# Patient Record
Sex: Female | Born: 1940 | Race: White | Hispanic: Yes | Smoking: Never smoker
Health system: Southern US, Community
[De-identification: ages and names within clinical notes are randomized; demographics above are authoritative.]

## PROBLEM LIST (undated history)

## (undated) DIAGNOSIS — E785 Hyperlipidemia, unspecified: Secondary | ICD-10-CM

## (undated) DIAGNOSIS — K219 Gastro-esophageal reflux disease without esophagitis: Secondary | ICD-10-CM

## (undated) DIAGNOSIS — F329 Major depressive disorder, single episode, unspecified: Secondary | ICD-10-CM

## (undated) DIAGNOSIS — M810 Age-related osteoporosis without current pathological fracture: Secondary | ICD-10-CM

## (undated) DIAGNOSIS — F32A Depression, unspecified: Secondary | ICD-10-CM

## (undated) DIAGNOSIS — I10 Essential (primary) hypertension: Secondary | ICD-10-CM

## (undated) DIAGNOSIS — H409 Unspecified glaucoma: Secondary | ICD-10-CM

## (undated) DIAGNOSIS — K5792 Diverticulitis of intestine, part unspecified, without perforation or abscess without bleeding: Secondary | ICD-10-CM

## (undated) DIAGNOSIS — M1990 Unspecified osteoarthritis, unspecified site: Secondary | ICD-10-CM

## (undated) DIAGNOSIS — L80 Vitiligo: Secondary | ICD-10-CM

## (undated) HISTORY — DX: Diverticulitis of intestine, part unspecified, without perforation or abscess without bleeding: K57.92

## (undated) HISTORY — DX: Essential (primary) hypertension: I10

## (undated) HISTORY — DX: Hyperlipidemia, unspecified: E78.5

## (undated) HISTORY — DX: Age-related osteoporosis without current pathological fracture: M81.0

## (undated) HISTORY — DX: Depression, unspecified: F32.A

## (undated) HISTORY — DX: Vitiligo: L80

## (undated) HISTORY — DX: Unspecified osteoarthritis, unspecified site: M19.90

## (undated) HISTORY — DX: Unspecified glaucoma: H40.9

## (undated) HISTORY — DX: Major depressive disorder, single episode, unspecified: F32.9

## (undated) HISTORY — DX: Gastro-esophageal reflux disease without esophagitis: K21.9

---

## 2016-02-18 ENCOUNTER — Encounter: Payer: Self-pay | Admitting: Family Medicine

## 2016-03-08 LAB — HM MAMMOGRAPHY

## 2016-11-24 LAB — BASIC METABOLIC PANEL
BUN: 12 (ref 4–21)
Creatinine: 0.4 — AB (ref <=1.1)
GLUCOSE: 99
Potassium: 4.4 (ref 3.4–5.3)
SODIUM: 141 (ref 137–147)

## 2016-11-24 LAB — TSH: TSH: 2.93 (ref <=5.90)

## 2016-11-24 LAB — LIPID PANEL
Cholesterol: 180 (ref 0–200)
HDL: 61 (ref 35–70)
LDL CALC: 97
TRIGLYCERIDES: 109 (ref 40–160)

## 2016-11-24 LAB — CBC AND DIFFERENTIAL
HEMATOCRIT: 40 (ref 36–46)
HEMOGLOBIN: 13.5 (ref 12.0–16.0)

## 2016-11-24 LAB — HEMOGLOBIN A1C: HEMOGLOBIN A1C: 5.3

## 2016-11-24 LAB — VITAMIN D 25 HYDROXY (VIT D DEFICIENCY, FRACTURES): Vit D, 25-Hydroxy: 47.3

## 2017-11-16 ENCOUNTER — Encounter: Payer: Self-pay | Admitting: Family Medicine

## 2017-12-12 ENCOUNTER — Other Ambulatory Visit: Payer: Self-pay

## 2017-12-12 ENCOUNTER — Ambulatory Visit: Payer: Medicare PPO | Admitting: Family Medicine

## 2017-12-12 ENCOUNTER — Encounter: Payer: Self-pay | Admitting: Family Medicine

## 2017-12-12 VITALS — BP 142/80 | HR 63 | Temp 98.0°F | Ht 61.5 in | Wt 147.2 lb

## 2017-12-12 DIAGNOSIS — I10 Essential (primary) hypertension: Secondary | ICD-10-CM | POA: Insufficient documentation

## 2017-12-12 DIAGNOSIS — K219 Gastro-esophageal reflux disease without esophagitis: Secondary | ICD-10-CM | POA: Diagnosis not present

## 2017-12-12 DIAGNOSIS — Z8719 Personal history of other diseases of the digestive system: Secondary | ICD-10-CM

## 2017-12-12 DIAGNOSIS — M810 Age-related osteoporosis without current pathological fracture: Secondary | ICD-10-CM

## 2017-12-12 DIAGNOSIS — F339 Major depressive disorder, recurrent, unspecified: Secondary | ICD-10-CM | POA: Insufficient documentation

## 2017-12-12 HISTORY — DX: Age-related osteoporosis without current pathological fracture: M81.0

## 2017-12-12 LAB — CBC WITH DIFFERENTIAL/PLATELET
BASOS PCT: 0.6 % (ref 0.0–3.0)
Basophils Absolute: 0 10*3/uL (ref 0.0–0.1)
EOS PCT: 2 % (ref 0.0–5.0)
Eosinophils Absolute: 0.1 10*3/uL (ref 0.0–0.7)
HCT: 38.2 % (ref 36.0–46.0)
Hemoglobin: 13.2 g/dL (ref 12.0–15.0)
LYMPHS ABS: 1.9 10*3/uL (ref 0.7–4.0)
Lymphocytes Relative: 32.9 % (ref 12.0–46.0)
MCHC: 34.7 g/dL (ref 30.0–36.0)
MCV: 95.5 fl (ref 78.0–100.0)
MONO ABS: 0.4 10*3/uL (ref 0.1–1.0)
MONOS PCT: 7.1 % (ref 3.0–12.0)
NEUTROS PCT: 57.4 % (ref 43.0–77.0)
Neutro Abs: 3.3 10*3/uL (ref 1.4–7.7)
Platelets: 216 10*3/uL (ref 150.0–400.0)
RBC: 4 Mil/uL (ref 3.87–5.11)
RDW: 12.6 % (ref 11.5–15.5)
WBC: 5.8 10*3/uL (ref 4.0–10.5)

## 2017-12-12 LAB — LIPID PANEL
Cholesterol: 167 mg/dL (ref 0–200)
HDL: 56 mg/dL (ref >39.00)
LDL Cholesterol: 92 mg/dL (ref 0–99)
NONHDL: 111.16
Total CHOL/HDL Ratio: 3
Triglycerides: 97 mg/dL (ref 0.0–149.0)
VLDL: 19.4 mg/dL (ref 0.0–40.0)

## 2017-12-12 LAB — COMPREHENSIVE METABOLIC PANEL
ALK PHOS: 92 U/L (ref 39–117)
ALT: 14 U/L (ref 0–35)
AST: 20 U/L (ref 0–37)
Albumin: 3.9 g/dL (ref 3.5–5.2)
BUN: 15 mg/dL (ref 6–23)
CHLORIDE: 103 meq/L (ref 96–112)
CO2: 29 meq/L (ref 19–32)
Calcium: 9 mg/dL (ref 8.4–10.5)
Creatinine, Ser: 0.74 mg/dL (ref 0.40–1.20)
GFR: 81.23 mL/min (ref >60.00)
GLUCOSE: 92 mg/dL (ref 70–99)
POTASSIUM: 4.1 meq/L (ref 3.5–5.1)
Sodium: 138 mEq/L (ref 135–145)
TOTAL PROTEIN: 6.4 g/dL (ref 6.0–8.3)
Total Bilirubin: 0.5 mg/dL (ref 0.2–1.2)

## 2017-12-12 LAB — TSH: TSH: 1.8 u[IU]/mL (ref 0.35–4.50)

## 2017-12-12 MED ORDER — ESCITALOPRAM OXALATE 10 MG PO TABS: 10.0000 mg | ORAL_TABLET | Freq: Every day | ORAL | 2 refills | Status: DC

## 2017-12-12 NOTE — Progress Notes (Signed)
Subjective  CC:  Chief Complaint  Patient presents with  . Establish Care    Feels Depressed  with Current Anxiety Medication, Headache.     HPI: Veronica Myers is a 77 y.o. female who presents to [facility] today to establish care with me as a new patient.   She has the following concerns or needs:  77 yo mother of 3 daughters whose husband passed away almost 2 years ago. She now spends 4 months with [place]. Here to establish care due to worsening sxs of depression.   Mood disorder: has struggled with mood for many of her adult years: anxiety mostly but was always able to manage with herbal and behavioral remedies. About 5 years ago, she became very anxious and depressed: cymbalta was started. Titrated up to 60mg  daily. Did fairly well until her husband passed away June 2017. Since, has transitioned to living with her daughters. Now, has multiple depression sxs including anhedonia, low motivation, hopelessness, hypokinesis, poor appetite. She is not suicidal but thinks about dying often now. Doesn't want to be a burden to her children. She thought these sxs were due to cymbalta so decreased dose back to 30mg  daily with no relief. Has never been to grief counseling nor therapy; no SI.   Essential hypertension: on meds. No CAD or CHF. Last labs were over a year ago. Denies SOB or chest pain.   HM: will need to be updated. She is not certain about many screens. Will need old records.   We updated and reviewed the patient's past history in detail and it is documented below.  Patient Active Problem List   Diagnosis Date Noted  . Major depression, recurrent, chronic (HCC) 12/12/2017  . Essential hypertension 12/12/2017  . GERD (gastroesophageal reflux disease) 12/12/2017  . History of diverticulitis 12/12/2017   Health Maintenance  Topic Date Due  . Janet BerlinETANUS/TDAP  07/25/1960  . DEXA SCAN  07/26/2007  . PNA  vac Low Risk Adult (1 of 2 - PCV13) 07/26/2007  . COLONOSCOPY  08/30/2017  . INFLUENZA VACCINE  03/30/2018    There is no immunization history on file for this patient. Current Meds  Medication Sig  . aspirin 81 MG chewable tablet Chew by mouth daily.  . B Complex Vitamins (B COMPLEX 1 PO) Take by mouth.  Marland Kitchen. CALCIUM CARB-MAGNESIUM CARB PO Take by mouth.  . clonazePAM (KLONOPIN) 1 MG tablet   . DULoxetine (CYMBALTA) 30 MG capsule   . Ginkgo Biloba 120 MG CAPS Take by mouth.  Marland Kitchen. GLUCOSAMINE-CHONDROIT-MSM-C-MN PO Take by mouth.  . metoprolol succinate (TOPROL-XL) 100 MG 24 hr tablet   . Multiple Vitamins-Minerals (ZINC PO) Take by mouth.  . Omega-3 Krill Oil 500 MG CAPS Take by mouth.  Marland Kitchen. omeprazole (PRILOSEC) 40 MG capsule   . Probiotic Product (ALIGN PO) Take by mouth.    Allergies: Patient has No Known Allergies. Past Medical History Patient  has a past medical history of Arthritis, Depression, Diverticulitis, GERD (gastroesophageal reflux disease), Glaucoma, and Hypertension. Past Surgical History Patient  has no past surgical history on file. Family History: Patient family history includes Arthritis in her father and mother; Depression in her father and mother; Diabetes in her father and mother; Heart disease in her father and mother; Hypertension in her father and mother. Social History:  Patient  reports that she has never smoked. She has never used smokeless tobacco. She reports that she drank  alcohol. She reports that she does not use drugs.  Review of Systems: Constitutional: negative for fever or malaise Ophthalmic: negative for photophobia, double vision or loss of vision Cardiovascular: negative for chest pain, dyspnea on exertion, or new LE swelling Respiratory: negative for SOB or persistent cough Gastrointestinal: negative for abdominal pain, change in bowel habits or melena Genitourinary: negative for dysuria or gross hematuria Musculoskeletal: negative for new gait  disturbance or muscular weakness Integumentary: negative for new or persistent rashes Neurological: negative for TIA or stroke symptoms Psychiatric: negative for SI or delusions Allergic/Immunologic: negative for hives  Patient Care Team    Relationship Specialty Notifications Start End  Willow Ora, MD PCP - General Family Medicine  12/12/17     Objective  Vitals: BP (!) 142/80   Pulse 63   Temp 98 F (36.7 C)   Ht 5' 1.5" (1.562 m)   Wt 147 lb 3.2 oz (66.8 kg)   BMI 27.36 kg/m  General:  Well developed, well nourished, no acute distress  Psych:  Alert and oriented, flat mood and affect, normal speech, fair insight, good grooming and eye contact HEENT:  Normocephalic, atraumatic, non-icteric sclera,  Cardiovascular:  RRR without gallop, rub or murmur, nondisplaced PMI, no pedal edema Respiratory:  Good breath sounds bilaterally, CTAB with normal respiratory effort Skin:  Warm, dry Neurologic:    Mental status is normal. No tremor, normal gait  Assessment  1. Major depression, recurrent, chronic (HCC)   2. Essential hypertension   3. Gastroesophageal reflux disease, esophagitis presence not specified   4. History of diverticulitis      Plan   Today's visit was 45 minutes long. Greater than 50% of this time was devoted to face to face counseling with the patient and coordination of care. We discussed her diagnosis, prognosis, treatment options and treatment plan is documented below.   Major depression, severe: wean off cymbalta and change to lexapro. Counseling done on severity, management strategies and appropriate expectations. She has good support. Check thyroid  HTN control is fair. Will reassess in 4-6 weeks. Adjust meds up if needed. Check renal function, electrolytes, lipids and blood counts.   GERD - rec prn omeprazole.   HM: will need to get up to date once mood is better controlled.   Follow up:  Return in about 6 weeks (around 01/23/2018) for mood follow  up.  Commons side effects, risks, benefits, and alternatives for medications and treatment plan prescribed today were discussed, and the patient expressed understanding of the given instructions. Patient is instructed to call or message via MyChart if he/she has any questions or concerns regarding our treatment plan. No barriers to understanding were identified. We discussed Red Flag symptoms and signs in detail. Patient expressed understanding regarding what to do in case of urgent or emergency type symptoms.   Medication list was reconciled, printed and provided to the patient in AVS. Patient instructions and summary information was reviewed with the patient as documented in the AVS. This note was prepared with assistance of Dragon voice recognition software. Occasional wrong-word or sound-a-like substitutions may have occurred due to the inherent limitations of voice recognition software  Orders Placed This Encounter  Procedures  . TSH  . Comprehensive metabolic panel  . CBC with Differential/Platelet  . Lipid panel   Meds ordered this encounter  Medications  . escitalopram (LEXAPRO) 10 MG tablet    Sig: Take 1 tablet (10 mg total) by mouth daily.    Dispense:  30 tablet  Refill:  2

## 2017-12-12 NOTE — Patient Instructions (Addendum)
Please return in 6 weeks to recheck your mood and go over your results.   Please wean off of your cymbalta as follows:  Tak cymbalta 30mg  every other day for a week, Then MWF for one week.  Then start lexapro, and take cymbalta MF, then stop cymbalta.    Depression Medications:  Taking the medicine as directed and not missing any doses is one of the best things you can do to treat your depression.  Here are some things to keep in mind:  1) Side effects (stomach upset, some increased anxiety) may happen before you notice a benefit.  These side effects typically go away over time. 2) Changes to your dose of medicine or a change in medication all together is sometimes necessary 3) Most people need to be on medication at least 6-12 months 4) Many people will notice an improvement within two weeks but the full effect of the medication can take up to 4-6 weeks 5) Stopping the medication when you start feeling better often results in a return of symptoms 6) If you start having thoughts of hurting yourself or others after starting this medicine, please call the office immediately at [phone].    When you're feeling too down to do anything, try these 10 Little things!  Take a shower. Even if you plan to stay in all day long and not see a soul, take a shower. It takes the most effort to hop in to the shower but once you do, you'll feel immediate results. It will wake you up and you'll be feeling much fresher (and cleaner too).  Brush and floss your teeth. Give your teeth a good brushing with a floss finish. It's a small task but it feels so good and you can check 'taking care of your health' off the list of things to do.  Do something small on your list. Most of us have some small thing we would like to get done (load of laundry, sew a button, email a friend). Doing one of these things will make you feel like you've accomplished something.  Drink water. Drinking water is easy right? It's  also really beneficial for your health so keep a glass beside you all day and take sips often. It gives you energy and prevents you from boredom eating.  Do some floor exercises. The last thing you want to do is exercise but it might be just the thing you need the most. Keep it simple and do exercises that involve sitting or laying on the floor. Even the smallest of exercises release chemicals in the brain that make you feel good. Yoga stretches or core exercises are going to make you feel good with minimal effort.  Make your bed. Making your bed takes a few minutes but it's productive and you'll feel relieved when it's done. An unmade bed is a huge visual reminder that you're having an unproductive day. Do it and consider it your housework for the day.  Put on some nice clothes. Take the sweatpants off even if you don't plan to go anywhere. Put on clothes that make you feel good. Take a look in the mirror so your brain recognizes the sweatpants have been replaced with clothes that make you look great. It's an instant confidence booster.  Wash the dishes. A pile of dirty dishes in the sink is a reflection of your mood. It's possible that if you wash up the dishes, your mood will follow suit. It's worth a try.  Adriana Simasook  a real meal. If you have the luxury to have a "do nothing" day, you have time to make a real meal for yourself. Make a meal that you love to eat. The process is good to get you out of the funk and the food will ensure you have more energy for tomorrow.  Write out your thoughts by hand. When you hand write, you stimulate your brain to focus on the moment that you're in so make yourself comfortable and write whatever comes into your mind. Put those thoughts out on paper so they stop spinning around in your head. Those thoughts might be the very thing holding you down.    It was a pleasure meeting you today! Thank you for choosing [place] to meet your healthcare needs! I truly look forward  to working with you. If you have any questions or concerns, please send me a message via Mychart or call the office at [phone].

## 2017-12-12 NOTE — Progress Notes (Signed)
Please call patient: I have reviewed his/her lab results. Please let pt know that all of the blood test results are perfect.

## 2017-12-21 ENCOUNTER — Encounter: Payer: Self-pay | Admitting: Emergency Medicine

## 2018-01-24 ENCOUNTER — Other Ambulatory Visit: Payer: Self-pay

## 2018-01-24 ENCOUNTER — Ambulatory Visit: Payer: Medicare PPO | Admitting: Family Medicine

## 2018-01-24 ENCOUNTER — Encounter: Payer: Self-pay | Admitting: Family Medicine

## 2018-01-24 VITALS — BP 126/78 | HR 61 | Temp 98.0°F | Resp 15 | Ht 61.5 in | Wt 147.8 lb

## 2018-01-24 DIAGNOSIS — Z1212 Encounter for screening for malignant neoplasm of rectum: Secondary | ICD-10-CM | POA: Diagnosis not present

## 2018-01-24 DIAGNOSIS — Z1211 Encounter for screening for malignant neoplasm of colon: Secondary | ICD-10-CM | POA: Diagnosis not present

## 2018-01-24 DIAGNOSIS — Z1231 Encounter for screening mammogram for malignant neoplasm of breast: Secondary | ICD-10-CM

## 2018-01-24 DIAGNOSIS — M7061 Trochanteric bursitis, right hip: Secondary | ICD-10-CM

## 2018-01-24 DIAGNOSIS — F339 Major depressive disorder, recurrent, unspecified: Secondary | ICD-10-CM

## 2018-01-24 DIAGNOSIS — I10 Essential (primary) hypertension: Secondary | ICD-10-CM

## 2018-01-24 DIAGNOSIS — E2839 Other primary ovarian failure: Secondary | ICD-10-CM | POA: Diagnosis not present

## 2018-01-24 DIAGNOSIS — Z1239 Encounter for other screening for malignant neoplasm of breast: Secondary | ICD-10-CM

## 2018-01-24 MED ORDER — RANITIDINE HCL 75 MG PO TABS: 75.0000 mg | ORAL_TABLET | Freq: Two times a day (BID) | ORAL | Status: DC | PRN

## 2018-01-24 NOTE — Progress Notes (Signed)
Subjective  CC:  Chief Complaint  Patient presents with  . Anxiety    doing much better on lexapro  . Back Pain    HPI: Veronica Myers is a 77 y.o. female who presents to the office today to address the problems listed above in the chief complaint, mood problems.  Doing [facility] better. Definitely feels that the cymbalta was making her feel drugged. Now, on lexapro and feeling back to herself. Mood is improved. No SEs.  Depression screen Veronica Myers 2/9 01/24/2018 12/12/2017  Decreased Interest 2 3  Down, Depressed, Hopeless 0 2  PHQ - 2 Score 2 5  Altered sleeping 0 -  Tired, decreased energy 1 3  Change in appetite 0 2  Feeling bad or failure about yourself  1 3  Trouble concentrating 0 3  Moving slowly or fidgety/restless 0 -  Suicidal thoughts 1 3  PHQ-9 Score 5 -  Difficult doing work/chores Not difficult at all Very difficult     C/o right sided low back pain radiates to knee and right hip pain worse while lying on that side. No weakness. No b/b dysfunction. Using a foam roller that helps. No knee pain.   HM: due for breast ca and crc screens; due for dexa fu with h/o osteoporosis.    Assessment  1. Major depression, recurrent, chronic (HCC)   2. Essential hypertension   3. Estrogen deficiency   4. Breast cancer screening   5. Greater trochanteric bursitis of right hip   6. Screening for colorectal cancer      Plan   Depression:  Much improved/continue lexpro 10 and rechekc 3 months.   Back pain: sciatica and hip bursitis. Routine post procedure care instructions were given to patient in detail. Start stretches demonstrated in office. Ibuprofen if needed.   GERD: ok to change to zantac daily if needed. Stop high dose prilosec  HM: mammo/dexa and cologuard ordered. Needs cpe and awv. Follow up: Return in about 3 months (around 04/26/2018) for complete physical, AWV.  Orders Placed This Encounter  Procedures  . DG Bone Density  . MM Digital Screening  . Cologuard    Meds ordered this encounter  Medications  . ranitidine (ZANTAC 75) 75 MG tablet    Sig: Take 1-2 tablets (75-150 mg total) by mouth 2 (two) times daily as needed for heartburn.      I reviewed the patients updated PMH, FH, and SocHx.    Patient Active Problem List   Diagnosis Date Noted  . Major depression, recurrent, chronic (HCC) 12/12/2017  . Essential hypertension 12/12/2017  . GERD (gastroesophageal reflux disease) 12/12/2017  . History of diverticulitis 12/12/2017  . Osteoporosis 12/12/2017   Current Meds  Medication Sig  . aspirin 81 MG chewable tablet Chew by mouth daily.  . B Complex Vitamins (B COMPLEX 1 PO) Take by mouth.  Marland Kitchen CALCIUM CARB-MAGNESIUM CARB PO Take by mouth.  . clonazePAM (KLONOPIN) 1 MG tablet   . escitalopram (LEXAPRO) 10 MG tablet Take 1 tablet (10 mg total) by mouth daily.  . Ginkgo Biloba 120 MG CAPS Take by mouth.  Marland Kitchen GLUCOSAMINE-CHONDROIT-MSM-C-MN PO Take by mouth.  . metoprolol succinate (TOPROL-XL) 100 MG 24 hr tablet   . Multiple Vitamins-Minerals (ZINC PO) Take by mouth.  . Omega-3 Krill Oil 500 MG CAPS Take by mouth.  . Probiotic Product (ALIGN PO) Take by mouth.  . [DISCONTINUED] DULoxetine (CYMBALTA) 30 MG capsule   . [DISCONTINUED] omeprazole (PRILOSEC) 40 MG capsule  Allergies: Patient has No Known Allergies. Family history:  Patient family history includes Arthritis in her father and mother; Depression in her father and mother; Diabetes in her father and mother; Heart disease in her father and mother; Hypertension in her father and mother. Social History   Socioeconomic History  . Marital status: Widowed    Spouse name: Not on file  . Number of children: 3  . Years of education: Not on file  . Highest education level: Not on file  Occupational History  . Not on file  Social Needs  . Financial resource strain: Not on file  . Food insecurity:    Worry: Not on file    Inability: Not on file  . Transportation needs:     Medical: Not on file    Non-medical: Not on file  Tobacco Use  . Smoking status: Never Smoker  . Smokeless tobacco: Never Used  Substance and Sexual Activity  . Alcohol use: Not Currently  . Drug use: Never  . Sexual activity: Not Currently  Lifestyle  . Physical activity:    Days per week: Not on file    Minutes per session: Not on file  . Stress: Not on file  Relationships  . Social connections:    Talks on phone: Not on file    Gets together: Not on file    Attends religious service: Not on file    Active member of club or organization: Not on file    Attends meetings of clubs or organizations: Not on file    Relationship status: Not on file  Other Topics Concern  . Not on file  Social History Narrative  . Not on file     Review of Systems: Constitutional: Negative for fever malaise or anorexia Cardiovascular: negative for chest pain Respiratory: negative for SOB or persistent cough Gastrointestinal: negative for abdominal pain  Objective  Vitals: BP 126/78   Pulse 61   Temp 98 F (36.7 C) (Oral)   Resp 15   Ht 5' 1.5" (1.562 m)   Wt 147 lb 12.8 oz (67 kg)   SpO2 97%   BMI 27.47 kg/m  General: no acute distress, well appearing, no apparent distress, well groomed Psych:  Alert and oriented x 3,normal mood, behavior, speech, dress, and thought processes. Looks great today. Cardiovascular:  RRR without murmur or gallop. no peripheral edema Respiratory:  Good breath sounds bilaterally, CTAB with normal respiratory effort Skin:  Warm, no rashes BACK: from, neg SLR B +right gr troch bursa ttp; tight quads and hamstrings., nl gait  GR Trochanteric Bursa steroid injection  Procedure Note   Pre-operative Diagnosis: right hip bursitis   Post-operative Diagnosis: same   Indications: pain   Anesthesia: cold spray   Procedure Details    Verbal consent was obtained for the procedure. Universal time out done. The point of maximum tenderness was identified and  marked over the hip bursa. The skin prepped with alcohol and cold spray used for anesthesia. A needle was advanced into the bursa and the steroid/lido was administered easily.    Complications:  None; patient tolerated the procedure well.     Commons side effects, risks, benefits, and alternatives for medications and treatment plan prescribed today were discussed, and the patient expressed understanding of the given instructions. Patient is instructed to call or message via MyChart if he/she has any questions or concerns regarding our treatment plan. No barriers to understanding were identified. We discussed Red Flag symptoms and signs in detail.  Patient expressed understanding regarding what to do in case of urgent or emergency type symptoms.   Medication list was reconciled, printed and provided to the patient in AVS. Patient instructions and summary information was reviewed with the patient as documented in the AVS. This note was prepared with assistance of Dragon voice recognition software. Occasional wrong-word or sound-a-like substitutions may have occurred due to the inherent limitations of voice recognition software

## 2018-01-24 NOTE — Patient Instructions (Addendum)
Please return in 3 months for your annual complete physical; please come fasting. Also can schedule an AWV.  Medicare recommends an Annual Wellness Visit for all patients. Please schedule this to be done with our Nurse Educator, Maudie Mercury. This is an informative "talk" visit; it's goals are to ensure that your health care needs are being met and to give you education regarding avoiding falls, ensuring you are not suffering from depression or problems with memory or thinking, and to educate you on Advance Care Planning. It helps me take good care of you!   We will call you with information regarding your referral appointment. Mammogram and bone density. I have ordered the cologuard test for colon cancer screening.  If you have any questions or concerns, please don't hesitate to send me a message via MyChart or call the office at [phone]. Thank you for visiting with Veronica Myers today! It's our pleasure caring for you.   Bursitis en la cadera (Hip Bursitis) La bursitis en la cadera es la hinchazn de una bolsa llena de lquido (bolsa sinovial) en la cadera. Esta hinchazn (inflamacin) puede ser dolorosa. Esta afeccin puede aparecer y Armed forces operational officer a lo largo del tiempo. [place] los medicamentos de venta libre y los recetados solamente como se lo haya indicado el mdico.  No conduzca ni use maquinaria pesada mientras toma analgsicos recetados, o como se lo haya indicado el mdico.  Si le recetaron un antibitico, tmelo como se lo haya indicado el mdico. No deje de tomar los antibiticos aunque comience a Sports administrator. Actividad  Retome sus actividades habituales como se lo haya indicado el mdico. Pregntele al mdico qu actividades son seguras para usted.  Haga reposo y protjase la cadera hasta que se sienta mejor. Instrucciones generales  Use vendas que le compriman la cadera (vendas de compresin) solamente como se lo haya indicado el mdico.  Levante  (eleve) la cadera por encima del nivel del corazn tanto como sea posible. Para hacerlo, intente colocar una almohada debajo de las caderas mientras est [place]. Si esto le causa dolor, deje de hacerlo.  No apoye el peso del cuerpo [employer] cadera, hasta tanto el mdico lo autorice.  Utilice las [employer] le haya indicado el mdico.  [place] y estire suavemente la zona de la lesin tan frecuentemente como le resulte cmodo.  Concurra a todas las visitas de control como se lo haya indicado el mdico. Esto es importante. PREVENCIN  Haga ejercicios con regularidad como se lo haya indicado el mdico.  Haga un precalentamiento y elongacin adecuados antes de la [place].  Reljese y elongue despus de realizar [place].  Evite las [employer] le causan molestias o dolor en la cadera.  Evite estar sentado [employer]. SOLICITE AYUDA SI:  Tiene fiebre.  Tiene sntomas nuevos.  Presenta dificultad para caminar.  Tiene dificultad para [employer] cotidianas.  Siente un dolor que [place].  El dolor no mejora con medicamentos.  Se le enrojece la piel de la zona de la cadera.  Tiene sensacin de calor en la zona de la cadera. SOLICITE AYUDA DE INMEDIATO SI:  No puede mover la cadera.  Siente Geophysical data processor. Esta informacin no tiene Marine scientist el consejo del mdico. Asegrese de hacerle al mdico cualquier pregunta que tenga. Document Released: 12/01/2010 Document Revised: 12/08/2015 Document Reviewed: 03/18/2015 Elsevier Interactive Patient Education  [employer].

## 2018-01-25 ENCOUNTER — Other Ambulatory Visit: Payer: Self-pay | Admitting: Family Medicine

## 2018-01-25 DIAGNOSIS — Z1231 Encounter for screening mammogram for malignant neoplasm of breast: Secondary | ICD-10-CM

## 2018-01-30 ENCOUNTER — Encounter: Payer: Self-pay | Admitting: Emergency Medicine

## 2018-01-30 MED ORDER — TRIAMCINOLONE ACETONIDE 40 MG/ML IJ SUSP
40.0000 mg | Freq: Once | INTRAMUSCULAR | Status: AC
  Administered 2018-01-30: 40 mg via INTRA_ARTICULAR

## 2018-01-30 MED ORDER — TRIAMCINOLONE ACETONIDE 40 MG/ML IJ SUSP: 40.0000 mg | Freq: Once | INTRAMUSCULAR | 0 refills | Status: DC

## 2018-01-30 NOTE — Addendum Note (Signed)
Addended byDene Gentry: Helmi Hechavarria M on: 01/30/2018 09:54 AM   Modules accepted: Orders

## 2018-02-06 ENCOUNTER — Other Ambulatory Visit: Payer: Self-pay | Admitting: Family Medicine

## 2018-02-06 MED ORDER — METOPROLOL SUCCINATE ER 100 MG PO TB24: 100.0000 mg | ORAL_TABLET | Freq: Every day | ORAL | 3 refills | Status: DC

## 2018-02-06 NOTE — Telephone Encounter (Signed)
Copied from CRM [id number]. Topic: Quick Communication - Rx Refill/Question >> Feb 06, 2018 11:02 AM Mickel BaasMcGee, Haytham Maher B, NT wrote: Medication: metoprolol succinate (TOPROL-XL) 100 MG 24 hr tablet  Has the patient contacted their pharmacy? Yes.   (Agent: If no, request that the patient contact the pharmacy for the refill.) (Agent: If yes, when and what did the pharmacy advise?)  Preferred Pharmacy (with phone number or street name): [facility] [id number] - [place], [place] - [address]  Agent: Please be advised that RX refills may take up to 3 business days. We ask that you follow-up with your pharmacy.

## 2018-02-06 NOTE — Telephone Encounter (Signed)
LOV  01/24/18 Dr. Mardelle MatteAndy No provider name on medication

## 2018-02-11 LAB — COLOGUARD: COLOGUARD: NEGATIVE

## 2018-02-14 ENCOUNTER — Telehealth: Payer: Self-pay | Admitting: Family Medicine

## 2018-02-14 NOTE — Telephone Encounter (Signed)
Copied from CRM [phone]#[id number]. Topic: Quick Communication - Lab Results >> Feb 14, 2018 11:11 AM Dene GentryPeterman, Amy M, LPN wrote: Called patient to inform them of 02/14/2018 lab results. When patient returns call, triage nurse may disclose results.  >> Feb 14, 2018 11:20 AM Elliot GaultBell, Tiffany M wrote: Relation to pt: self Call back [phone]   Reason for call:  Patient returning call regarding lab results, please advise

## 2018-02-14 NOTE — Progress Notes (Signed)
Please call patient: I have reviewed his/her lab results. Please let her know that her cologuard colon cancer screening test was negative. We can repeat this in 3 years.

## 2018-02-15 NOTE — Telephone Encounter (Signed)
Attempted to call patient- left message she may call back and that a letter with results has been mailed to her.

## 2018-02-27 ENCOUNTER — Telehealth: Payer: Self-pay | Admitting: Family Medicine

## 2018-02-27 NOTE — Telephone Encounter (Signed)
Appt. Made with Dr. Mardelle MatteAndy on 03/03/2018 at 11:30am to discuss H Pylori Testing.   Kathi SimpersAmy Kynnedy Carreno,  LPN

## 2018-02-27 NOTE — Telephone Encounter (Signed)
Please call patient: I will be happy to see her to discuss testing for her problem. She can schedule an appointment. We do test for H.pylori infections with a blood test. We can discuss if this is indicated and what the differences are between the tests at her office visit if needed. Thanks.   Copied from CRM [phone]. Topic: General - Other >> Feb 27, 2018 12:06 PM Darletta MollLander, Lumin L wrote: Reason for CRM: Patient would like to do an "h pylori" test. It has to do with acid build up in  her stomach. She's not sure what it's called but it requires the testee to blow into a balloon and take antibiotics. Patient would like a call back as soon as possible.

## 2018-03-03 ENCOUNTER — Ambulatory Visit: Payer: Medicare PPO | Admitting: Family Medicine

## 2018-03-03 ENCOUNTER — Encounter: Payer: Self-pay | Admitting: Family Medicine

## 2018-03-03 ENCOUNTER — Other Ambulatory Visit: Payer: Self-pay

## 2018-03-03 ENCOUNTER — Other Ambulatory Visit: Payer: Self-pay | Admitting: Emergency Medicine

## 2018-03-03 VITALS — BP 120/68 | HR 64 | Temp 98.2°F | Ht 61.5 in | Wt 148.6 lb

## 2018-03-03 DIAGNOSIS — K219 Gastro-esophageal reflux disease without esophagitis: Secondary | ICD-10-CM | POA: Diagnosis not present

## 2018-03-03 LAB — H. PYLORI ANTIBODY, IGG: H PYLORI IGG: NEGATIVE

## 2018-03-03 MED ORDER — OMEPRAZOLE 20 MG PO CPDR: 20.0000 mg | DELAYED_RELEASE_CAPSULE | Freq: Every day | ORAL | 1 refills | Status: DC

## 2018-03-03 NOTE — Addendum Note (Signed)
Addended by: Asencion PartridgeANDY, Larcenia Holaday on: 03/03/2018 04:11 PM   Modules accepted: Orders

## 2018-03-03 NOTE — Progress Notes (Signed)
Subjective  CC:  Chief Complaint  Patient presents with  . Consult    Patient would like to discuss H Pylori Testing   . Gastroesophageal Reflux    Patient states that she has been experiencing more Indigestion after eating     HPI: Veronica Myers is a 77 y.o. female who presents to the office today to address the problems listed above in the chief complaint.  Veronica Myers has chronic history of GERD.  At last visit we stopped her Prilosec and changed to as needed Zantac because she had been so well controlled.  Since then, she has increased reflux symptoms.  Also complains of bad breath and mild heartburn.  Has used Zantac intermittently and also Tums without complete relief.  Friend of hers was treated for H. pylori and now is asymptomatic.  She would like the same.  She denies hematemesis, nausea, vomiting, melena, or persistent abdominal pain or weight loss.  Assessment  1. Gastroesophageal reflux disease without esophagitis      Plan   GERD: Check H. pylori antibodies.  Education given.  Await results.  Will treat if positive, or restart PPI if negative.  Patient understands and agrees with care plan.  Follow up: has follow-up for complete physical scheduled. Orders Placed This Encounter  Procedures  . H. pylori antibody, IgG   No orders of the defined types were placed in this encounter.     I reviewed the patients updated PMH, FH, and SocHx.    Patient Active Problem List   Diagnosis Date Noted  . Major depression, recurrent, chronic (HCC) 12/12/2017  . Essential hypertension 12/12/2017  . GERD (gastroesophageal reflux disease) 12/12/2017  . History of diverticulitis 12/12/2017  . Osteoporosis 12/12/2017   Current Meds  Medication Sig  . aspirin 81 MG chewable tablet Chew by mouth daily.  . B Complex Vitamins (B COMPLEX 1 PO) Take by mouth.  Marland Kitchen CALCIUM CARB-MAGNESIUM CARB PO Take by mouth.  . clonazePAM (KLONOPIN) 1 MG tablet   . escitalopram (LEXAPRO) 10 MG tablet  Take 1 tablet (10 mg total) by mouth daily.  . Ginkgo Biloba 120 MG CAPS Take by mouth.  Marland Kitchen GLUCOSAMINE-CHONDROIT-MSM-C-MN PO Take by mouth.  . metoprolol succinate (TOPROL-XL) 100 MG 24 hr tablet Take 1 tablet (100 mg total) by mouth daily.  . Multiple Vitamins-Minerals (ZINC PO) Take by mouth.  . Omega-3 Krill Oil 500 MG CAPS Take by mouth.  . Probiotic Product (ALIGN PO) Take by mouth.  . ranitidine (ZANTAC 75) 75 MG tablet Take 1-2 tablets (75-150 mg total) by mouth 2 (two) times daily as needed for heartburn.    Allergies: Patient has No Known Allergies. Family History: Patient family history includes Arthritis in her father and mother; Depression in her father and mother; Diabetes in her father and mother; Heart disease in her father and mother; Hypertension in her father and mother. Social History:  Patient  reports that she has never smoked. She has never used smokeless tobacco. She reports that she drank alcohol. She reports that she does not use drugs.  Review of Systems: Constitutional: Negative for fever malaise or anorexia Cardiovascular: negative for chest pain Respiratory: negative for SOB or persistent cough Gastrointestinal: negative for abdominal pain  Objective  Vitals: BP 120/68   Pulse 64   Temp 98.2 F (36.8 C)   Ht 5' 1.5" (1.562 m)   Wt 148 lb 9.6 oz (67.4 kg)   SpO2 97%   BMI 27.62 kg/m  General: no acute  distress , A&Ox3 HEENT: PEERL, conjunctiva normal, Oropharynx moist,neck is supple Cardiovascular:  RRR without murmur or gallop.  Respiratory:  Good breath sounds bilaterally, CTAB with normal respiratory effort Skin:  Warm, no rashes Benign abdominal exam.     Commons side effects, risks, benefits, and alternatives for medications and treatment plan prescribed today were discussed, and the patient expressed understanding of the given instructions. Patient is instructed to call or message via MyChart if he/she has any questions or concerns  regarding our treatment plan. No barriers to understanding were identified. We discussed Red Flag symptoms and signs in detail. Patient expressed understanding regarding what to do in case of urgent or emergency type symptoms.   Medication list was reconciled, printed and provided to the patient in AVS. Patient instructions and summary information was reviewed with the patient as documented in the AVS. This note was prepared with assistance of Dragon voice recognition software. Occasional wrong-word or sound-a-like substitutions may have occurred due to the inherent limitations of voice recognition software

## 2018-03-03 NOTE — Patient Instructions (Signed)
I will release your lab results to you on your MyChart account with further instructions. Please reply with any questions.  If positive, I will treat you with antibiotics. If negative, I will restart an antacid medication for you.   Helicobacter Pylori Antibodies Test Why am I having this test? This is a blood test that looks for bacteria called Helicobacter pylori (H. pylori). H. pylori is a germ that can be found in the cells that line the stomach. Having high levels of H. pylori in your stomach puts you at risk for stomach ulcers and small bowel ulcers, long-term (chronic) inflammation of the lining of the stomach, or even ulcers that may occur in the canal that runs from the mouth to the stomach (esophagus). Presence of H. pylori can also increase your risk for stomach cancer if it is left untreated. Most people with H. pylori in their stomach bacteria have no symptoms. Your health care provider may ask you to have this test if you have symptoms of a stomach ulcer or small bowel ulcer, such as stomach pain before or after eating, heartburn, or nausea repeatedly after eating. What kind of sample is taken? A blood sample is required for this test. It is usually collected by inserting a needle into a vein or sticking a finger with a small needle. How do I prepare for this test? There is no preparation required for this test. What are the reference ranges? Reference ranges are considered healthy ranges established after testing a large group of healthy people. Reference ranges may vary among different people, labs, and hospitals. It is your responsibility to obtain your test results. Ask the lab or department performing the test when and how you will get your results. Your test results will be reported as positive, negative, or equivocal. Equivocal means that your results are neither positive nor negative. References ranges for these results are as follows:  Less than or equal to 30 units/mL. This is  negative.  Greater than or equal to 40 units/mL. This is positive.  30.01-39.99 units/mL. This is equivocal.  What do the results mean? Test results that are higher than normal may indicate numerous health conditions. These may include:  Short-term or long-term irritation of the stomach lining (gastritis).  Small bowel ulcer.  Stomach ulcer.  Stomach cancer.  Talk with your health care provider to discuss your results, treatment options, and if necessary, the need for more tests. Talk with your health care provider if you have any questions about your results. Talk with your health care provider to discuss your results, treatment options, and if necessary, the need for more tests. Talk with your health care provider if you have any questions about your results. This information is not intended to replace advice given to you by your health care provider. Make sure you discuss any questions you have with your health care provider. Document Released: 09/09/2004 Document Revised: 04/21/2016 Document Reviewed: 12/28/2013 Elsevier Interactive Patient Education  2018 [employer]

## 2018-03-03 NOTE — Progress Notes (Signed)
Please call patient: I have reviewed his/her lab results. The h.pylori test is negative. Please restart the PPI medication, omeprazole 20 mg daily. F/u after 6 weeks if sxs are not improved.

## 2018-03-09 ENCOUNTER — Ambulatory Visit
Admission: RE | Admit: 2018-03-09 | Discharge: 2018-03-09 | Disposition: A | Discharge status: Home / Self Care | Payer: Medicare PPO | Source: Ambulatory Visit | Attending: Family Medicine | Admitting: Family Medicine

## 2018-03-09 ENCOUNTER — Ambulatory Visit: Payer: Medicare PPO

## 2018-03-09 ENCOUNTER — Inpatient Hospital Stay: Admission: RE | Admit: 2018-03-09 | Payer: Medicare PPO | Source: Ambulatory Visit

## 2018-03-09 DIAGNOSIS — Z1231 Encounter for screening mammogram for malignant neoplasm of breast: Secondary | ICD-10-CM

## 2018-03-09 DIAGNOSIS — E2839 Other primary ovarian failure: Secondary | ICD-10-CM

## 2018-03-09 NOTE — Progress Notes (Signed)
Please call patient: I have reviewed his/her bone density results: she has significant osteoporosis that needs treatment.  We can discuss at her f/u appt in august. Sooner if she'd like to schedule a sooner apt.

## 2018-03-10 ENCOUNTER — Telehealth: Payer: Self-pay

## 2018-03-10 NOTE — Telephone Encounter (Signed)
Called patient and she stated that she has already given the image place the telephone number and fax number to the [place].  Copied from CRM [phone]. Topic: Inquiry >> Mar 10, 2018  8:41 AM Windy KalataMichael, Taylor L, NT wrote: Reason for CRM: patient is calling and requesting Dr. Mardelle MatteAndy nurse call her, she states she went to get her mammogram done and they are requesting her last mammogram results. Please advise/

## 2018-03-13 ENCOUNTER — Encounter: Payer: Self-pay | Admitting: Family Medicine

## 2018-03-13 ENCOUNTER — Other Ambulatory Visit: Payer: Self-pay

## 2018-03-13 ENCOUNTER — Ambulatory Visit: Payer: Medicare PPO | Admitting: Family Medicine

## 2018-03-13 VITALS — BP 112/78 | HR 82 | Temp 98.6°F | Ht 61.5 in | Wt 150.2 lb

## 2018-03-13 DIAGNOSIS — M810 Age-related osteoporosis without current pathological fracture: Secondary | ICD-10-CM

## 2018-03-13 DIAGNOSIS — K219 Gastro-esophageal reflux disease without esophagitis: Secondary | ICD-10-CM

## 2018-03-13 MED ORDER — IBANDRONATE SODIUM 150 MG PO TABS
150.0000 mg | ORAL_TABLET | ORAL | 3 refills | Status: DC
Start: 2018-03-13 — End: 2019-08-07

## 2018-03-13 NOTE — Progress Notes (Signed)
Subjective  CC:  Chief Complaint  Patient presents with  . Osteoporosis    Patient presents to discuss medication management for Osteoporosis    HPI: Veronica Myers is a 77 y.o. female who presents to the office today to address the problems listed above in the chief complaint.  Reviewed recent bone density findings showing severe osteoporosis. See PL and report. Pt has been off biphosphonates x 1 year. Self d/cd due to hearing information of risk from friend. Had tolerated fosamax and boniva. Took for total of about 4-6 months. I don't have prior bone density reports. She has had fracture due to fall   gerd sxs are now resolved on PPI.    Assessment  1. Age-related osteoporosis without current pathological fracture   2. Gastroesophageal reflux disease, esophagitis presence not specified      Plan   osteoporosis:  Educated and reviewed results. Discussed treatment options and rationale for treatment. Discussed cal and vit d and exercise recs. Start boniva. Recheck dexa in one year.  Gerd:monitor for gerd related sxs worsening on boniva. Change to prolia if needed.   Follow up: Return for as scheduled.   No orders of the defined types were placed in this encounter.  Meds ordered this encounter  Medications  . ibandronate (BONIVA) 150 MG tablet    Sig: Take 1 tablet (150 mg total) by mouth every 30 (thirty) days. With water, empty stomach, sit up for 30 minutes    Dispense:  3 tablet    Refill:  3      I reviewed the patients updated PMH, FH, and SocHx.    Patient Active Problem List   Diagnosis Date Noted  . Major depression, recurrent, chronic (HCC) 12/12/2017  . Essential hypertension 12/12/2017  . GERD (gastroesophageal reflux disease) 12/12/2017  . History of diverticulitis 12/12/2017  . Osteoporosis 12/12/2017   Current Meds  Medication Sig  . B Complex Vitamins (B COMPLEX 1 PO) Take by mouth.  Marland Kitchen CALCIUM CARB-MAGNESIUM CARB PO Take by mouth.  . clonazePAM  (KLONOPIN) 1 MG tablet   . escitalopram (LEXAPRO) 10 MG tablet Take 1 tablet (10 mg total) by mouth daily.  . Ginkgo Biloba 120 MG CAPS Take by mouth.  Marland Kitchen GLUCOSAMINE-CHONDROIT-MSM-C-MN PO Take by mouth.  . metoprolol succinate (TOPROL-XL) 100 MG 24 hr tablet Take 1 tablet (100 mg total) by mouth daily.  . Multiple Vitamins-Minerals (ZINC PO) Take by mouth.  . Omega-3 Krill Oil 500 MG CAPS Take by mouth.  Marland Kitchen omeprazole (PRILOSEC) 20 MG capsule Take 1 capsule (20 mg total) by mouth daily.  . Probiotic Product (ALIGN PO) Take by mouth.  . ranitidine (ZANTAC 75) 75 MG tablet Take 1-2 tablets (75-150 mg total) by mouth 2 (two) times daily as needed for heartburn.  . [DISCONTINUED] aspirin 81 MG chewable tablet Chew by mouth daily.    Allergies: Patient has No Known Allergies. Family History: Patient family history includes Arthritis in her father and mother; Depression in her father and mother; Diabetes in her father and mother; Heart disease in her father and mother; Hypertension in her father and mother. Social History:  Patient  reports that she has never smoked. She has never used smokeless tobacco. She reports that she drank alcohol. She reports that she does not use drugs.  Review of Systems: Constitutional: Negative for fever malaise or anorexia Cardiovascular: negative for chest pain Respiratory: negative for SOB or persistent cough Gastrointestinal: negative for abdominal pain  Objective  Vitals: BP 112/78  Pulse 82   Temp 98.6 F (37 C)   Ht 5' 1.5" (1.562 m)   Wt 150 lb 3.2 oz (68.1 kg)   BMI 27.92 kg/m  General: no acute distress , A&Ox3 HEENT: PEERL, conjunctiva normal, Oropharynx moist,neck is supple Cardiovascular:  RRR without murmur or gallop.  Respiratory:  Good breath sounds bilaterally, CTAB with normal respiratory effort Skin:  Warm, no rashes     Commons side effects, risks, benefits, and alternatives for medications and treatment plan prescribed today  were discussed, and the patient expressed understanding of the given instructions. Patient is instructed to call or message via MyChart if he/she has any questions or concerns regarding our treatment plan. No barriers to understanding were identified. We discussed Red Flag symptoms and signs in detail. Patient expressed understanding regarding what to do in case of urgent or emergency type symptoms.   Medication list was reconciled, printed and provided to the patient in AVS. Patient instructions and summary information was reviewed with the patient as documented in the AVS. This note was prepared with assistance of Dragon voice recognition software. Occasional wrong-word or sound-a-like substitutions may have occurred due to the inherent limitations of voice recognition software

## 2018-03-13 NOTE — Patient Instructions (Signed)
Start taking the boniva monthly as discussed.  Stop the aspirin.  Call me if you start having problems with stomach pain again; take the omeprazole as needed.   Calcium Intake Recommendations You can take Caltrate Plus twice a day or get it through your diet or other OTC supplements (Viactiv, OsCal etc)  Calcium is a mineral that affects many functions in the body, including:  Blood clotting.  Blood vessel function.  Nerve impulse conduction.  Hormone secretion.  Muscle contraction.  Bone and teeth functions.  Most of your body's calcium supply is stored in your bones and teeth. When your calcium stores are low, you may be at risk for low bone mass, bone loss, and bone fractures. Consuming enough calcium helps to grow healthy bones and teeth and to prevent breakdown over time. It is very important that you get enough calcium if you are:  A child undergoing rapid growth.  An adolescent girl.  A pre- or post-menopausal woman.  A woman whose menstrual cycle has stopped due to anorexia nervosa or regular intense exercise.  An individual with lactose intolerance or a milk allergy.  A vegetarian.  What is my plan? Try to consume the recommended amount of calcium daily based on your age. Depending on your overall health, your health care provider may recommend increased calcium intake.General daily calcium intake recommendations by age are:  Birth to 6 months: 200 mg.  Infants 7 to 12 months: 260 mg.  Children 1 to 3 years: 700 mg.  Children 4 to 8 years: 1,000 mg.  Children 9 to 13 years: 1,300 mg.  Teens 14 to 18 years: 1,300 mg.  Adults 19 to 50 years: 1,000 mg.  Adult women 51 to 70 years: 1,200 mg.  Adult men 51 to 70 years: 1,000 mg.  Adults 71 years and older: 1,200 mg.  Pregnant and breastfeeding teens: 1,300 mg.  Pregnant and breastfeeding adults: 1,000 mg.  What do I need to know about calcium intake?  In order for the body to absorb calcium, it  needs vitamin D. You can get vitamin D through (we recommend getting [phone] units of Vitamin D daily) ? Direct exposure of the skin to sunlight. ? Foods, such as egg yolks, liver, saltwater fish, and fortified milk. ? Supplements.  Consuming too much calcium may cause: ? Constipation. ? Decreased absorption of iron and zinc. ? Kidney stones.  Calcium supplements may interact with certain medicines. Check with your health care provider before starting any calcium supplements.  Try to get most of your calcium from food. What foods can I eat? Grains  Fortified oatmeal. Fortified ready-to-eat cereals. Fortified frozen waffles. Vegetables Turnip greens. Broccoli. Fruits Fortified orange juice. Meats and Other Protein Sources Canned sardines with bones. Canned salmon with bones. Soy beans. Tofu. Baked beans. Almonds. [place] nuts. Sunflower seeds. Dairy Milk. Yogurt. Cheese. Cottage cheese. Beverages Fortified soy milk. Fortified rice milk. Sweets/Desserts Pudding. Ice Cream. Milkshakes. Blackstrap molasses. The items listed above may not be a complete list of recommended foods or beverages. Contact your dietitian for more options. What foods can affect my calcium intake? It may be more difficult for your body to use calcium or calcium may leave your body more quickly if you consume large amounts of:  Sodium.  Protein.  Caffeine.  Alcohol.  This information is not intended to replace advice given to you by your health care provider. Make sure you discuss any questions you have with your health care provider. Document Released: 03/30/2004 Document  Revised: 03/05/2016 Document Reviewed: 01/22/2014 Elsevier Interactive Patient Education  2018 [employer]    Osteoporosis Osteoporosis is the thinning and loss of density in the bones. Osteoporosis makes the bones more brittle, fragile, and likely to break (fracture). Over time, osteoporosis can cause the bones to become so  weak that they fracture after a simple fall. The bones most likely to fracture are the bones in the hip, wrist, and spine. What are the causes? The exact cause is not known. What increases the risk? Anyone can develop osteoporosis. You may be at greater risk if you have a family history of the condition or have poor nutrition. You may also have a higher risk if you are:  Female.  76 years old or older.  A smoker.  Not physically active.  White or Asian.  Slender.  What are the signs or symptoms? A fracture might be the first sign of the disease, especially if it results from a fall or injury that would not usually cause a bone to break. Other signs and symptoms include:  Low back and neck pain.  Stooped posture.  Height loss.  How is this diagnosed? To make a diagnosis, your health care provider may:  Take a medical history.  Perform a physical exam.  Order tests, such as: ? A bone mineral density test. ? A dual-energy X-ray absorptiometry test.  How is this treated? The goal of osteoporosis treatment is to strengthen your bones to reduce your risk of a fracture. Treatment may involve:  Making lifestyle changes, such as: ? Eating a diet rich in calcium. ? Doing weight-bearing and muscle-strengthening exercises. ? Stopping tobacco use. ? Limiting alcohol intake.  Taking medicine to slow the process of bone loss or to increase bone density.  Monitoring your levels of calcium and vitamin D.  Follow these instructions at home:  Include calcium and vitamin D in your diet. Calcium is important for bone health, and vitamin D helps the body absorb calcium.  Perform weight-bearing and muscle-strengthening exercises as directed by your health care provider.  Do not use any tobacco products, including cigarettes, chewing tobacco, and electronic cigarettes. If you need help quitting, ask your health care provider.  Limit your alcohol intake.  Take medicines only as  directed by your health care provider.  Keep all follow-up visits as directed by your health care provider. This is important.  Take precautions at home to lower your risk of falling, such as: ? Keeping rooms well lit and clutter free. ? Installing safety rails on stairs. ? Using rubber mats in the bathroom and other areas that are often wet or slippery. Get help right away if: You fall or injure yourself. This information is not intended to replace advice given to you by your health care provider. Make sure you discuss any questions you have with your health care provider. Document Released: 05/26/2005 Document Revised: 01/19/2016 Document Reviewed: 01/24/2014 Elsevier Interactive Patient Education  [employer]2018 Elsevier Inc.

## 2018-03-14 ENCOUNTER — Encounter: Payer: Self-pay | Admitting: Family Medicine

## 2018-03-16 ENCOUNTER — Telehealth: Payer: Self-pay | Admitting: Family Medicine

## 2018-03-16 ENCOUNTER — Other Ambulatory Visit: Payer: Self-pay | Admitting: Family Medicine

## 2018-03-16 MED ORDER — ESCITALOPRAM OXALATE 20 MG PO TABS: 20.0000 mg | ORAL_TABLET | Freq: Every day | ORAL | 0 refills | Status: DC

## 2018-03-16 NOTE — Telephone Encounter (Signed)
Patient states that she feels stressed and anxious,she notices that her blood pressure is elevated as well.  She thinks  that 10 mg is not strong enough, she has been taking two 10mg  tablets and that helps  But doesn't have enough for her to continue to do that.   Please advise.   Veronica SimpersAmy Peterman,  LPN

## 2018-03-16 NOTE — Telephone Encounter (Signed)
L/m to [facility]

## 2018-03-16 NOTE — Telephone Encounter (Signed)
Ok to send new prescription for Lexapro 20mg , #30, no refills

## 2018-03-16 NOTE — Telephone Encounter (Signed)
Pt returning Amy's call.  Please call pt: [phone]

## 2018-03-16 NOTE — Telephone Encounter (Signed)
Patient informed of Rx sent to pharmacy. Patient verbalized understanding.   Kathi SimpersAmy Peterman,  LPN

## 2018-03-16 NOTE — Telephone Encounter (Signed)
Copied from CRM [phone]. Topic: Quick Communication - See Telephone Encounter >> Mar 16, 2018  9:46 AM Jolayne Hainesaylor, Brittany L wrote: CRM for notification. See Telephone encounter for: 03/16/18.    Patient said she thinks that escitalopram (LEXAPRO) 10 MG tablet is strong enough, she wants to go back to 20mg . Please advise.

## 2018-03-16 NOTE — Addendum Note (Signed)
Addended byDene Gentry: Biagio Snelson M on: 03/16/2018 04:39 PM   Modules accepted: Orders

## 2018-03-22 ENCOUNTER — Telehealth: Payer: Self-pay | Admitting: Family Medicine

## 2018-03-22 NOTE — Telephone Encounter (Signed)
Copied from CRM [phone]. Topic: Quick Communication - See Telephone Encounter >> Mar 22, 2018 12:00 PM Waymon AmatoBurton, Donna F wrote: Pt is needing a refill on clonazapam   [facility] [address]  Best number [phone]

## 2018-03-23 ENCOUNTER — Ambulatory Visit: Payer: Self-pay | Admitting: *Deleted

## 2018-03-23 NOTE — Telephone Encounter (Signed)
Please call patient to clarify symptoms: swelling of face? Sob? - if so, needs assessment now. If rash, itching, rec ov with me tomorrow. May use benadryl for symptoms now.

## 2018-03-23 NOTE — Telephone Encounter (Signed)
Please advise 

## 2018-03-23 NOTE — Telephone Encounter (Signed)
Patient is taking Boniva and she is having side effects.  Reason for Disposition . Caller has NON-URGENT medication question about med that PCP prescribed and triager unable to answer question  Answer Assessment - Initial Assessment Questions 1. SYMPTOMS: "Do you have any symptoms?"     Patient is reports: hot flashes, itching and redness- arms and neck, face is puffy 2. SEVERITY: If symptoms are present, ask "Are they mild, moderate or severe?"      In eight days- it has occurred 3 times  Protocols used: MEDICATION QUESTION CALL-A-AH  Patient took pill Saturday- she is not due another pill until next month- patient  wants to know if there are any suggestions for her.

## 2018-03-23 NOTE — Telephone Encounter (Signed)
clonazapam refill Last Refill:10/2617 # 90 Last OV: 01/24/18 with Dr. Asencion PartridgeAndy Myers PCP: see above [facility]

## 2018-03-23 NOTE — Telephone Encounter (Signed)
Patient states that she took medication on Saturday, starting having feeling of flushing, no SOB. Skin has been itchy, redness, off and on. Patient states that she has this feeling for about an hour and then goes away. Patient said she had these episodes on Sunday, Tuesday and this morning.   Kathi SimpersAmy Peterman,  LPN

## 2018-03-24 MED ORDER — CLONAZEPAM 1 MG PO TABS: 1.0000 mg | ORAL_TABLET | Freq: Every day | ORAL | 0 refills | Status: DC | PRN

## 2018-03-24 NOTE — Telephone Encounter (Signed)
rec monitoring. IF she doesn't have a rash and sxs subside, no ov needed and I recommend taking benadryl 25mg  30 minutes before next boniva dose.   IF sxs recur or worsen, then OV.

## 2018-03-24 NOTE — Telephone Encounter (Signed)
[facility]  Kathi SimpersAmy Peterman,  LPN

## 2018-03-24 NOTE — Telephone Encounter (Signed)
LM to RC  

## 2018-03-24 NOTE — Addendum Note (Signed)
Addended by: Asencion PartridgeANDY, Caelin Rosen on: 03/24/2018 11:15 AM   Modules accepted: Orders

## 2018-03-29 NOTE — Telephone Encounter (Signed)
Patient informed and she states that she will take the Benadryl prior to taking medications.   Verbalized understanding.   Kathi SimpersAmy Pamela Maddy,  LPN

## 2018-03-30 ENCOUNTER — Ambulatory Visit (INDEPENDENT_AMBULATORY_CARE_PROVIDER_SITE_OTHER): Payer: Medicare PPO | Admitting: Family Medicine

## 2018-03-30 ENCOUNTER — Other Ambulatory Visit: Payer: Self-pay

## 2018-03-30 ENCOUNTER — Encounter: Payer: Self-pay | Admitting: Family Medicine

## 2018-03-30 VITALS — BP 112/70 | HR 68 | Temp 98.4°F | Ht 61.5 in | Wt 149.0 lb

## 2018-03-30 DIAGNOSIS — Z0000 Encounter for general adult medical examination without abnormal findings: Secondary | ICD-10-CM | POA: Diagnosis not present

## 2018-03-30 DIAGNOSIS — I10 Essential (primary) hypertension: Secondary | ICD-10-CM | POA: Diagnosis not present

## 2018-03-30 DIAGNOSIS — M810 Age-related osteoporosis without current pathological fracture: Secondary | ICD-10-CM

## 2018-03-30 DIAGNOSIS — L80 Vitiligo: Secondary | ICD-10-CM | POA: Diagnosis not present

## 2018-03-30 DIAGNOSIS — T50905A Adverse effect of unspecified drugs, medicaments and biological substances, initial encounter: Secondary | ICD-10-CM | POA: Diagnosis not present

## 2018-03-30 LAB — VITAMIN D 25 HYDROXY (VIT D DEFICIENCY, FRACTURES): VITD: 33.16 ng/mL (ref 30.00–100.00)

## 2018-03-30 MED ORDER — ZOSTER VAC RECOMB ADJUVANTED 50 MCG/0.5ML IM SUSR: 0.5000 mL | Freq: Once | INTRAMUSCULAR | 0 refills | Status: DC

## 2018-03-30 MED ORDER — PNEUMOCOCCAL 13-VAL CONJ VACC IM SUSP: 0.5000 mL | Freq: Once | INTRAMUSCULAR | 0 refills | Status: DC

## 2018-03-30 MED ORDER — ZOSTER VAC RECOMB ADJUVANTED 50 MCG/0.5ML IM SUSR: 0.5000 mL | Freq: Once | INTRAMUSCULAR | 0 refills | Status: AC

## 2018-03-30 MED ORDER — PNEUMOCOCCAL 13-VAL CONJ VACC IM SUSP: 0.5000 mL | Freq: Once | INTRAMUSCULAR | 0 refills | Status: AC

## 2018-03-30 NOTE — Patient Instructions (Addendum)
Please return in 6 months for recheck of blood pressure.  We will all you to set up an appointment for Prolia, to treat your osteoporosis.  STOP the bonvia. Start walking again for exercise.   Go the pharmacy to get your Prevnar and Shingrix vaccinations.   If you have any questions or concerns, please don't hesitate to send me a message via MyChart or call the office at [phone]. Thank you for visiting with us today! It's our pleasure caring for you.  Pneumococcal Conjugate Vaccine suspension for injection What is this medicine? PNEUMOCOCCAL VACCINE (NEU mo KOK al vak SEEN) is a vaccine used to prevent pneumococcus bacterial infections. These bacteria can cause serious infections like pneumonia, meningitis, and blood infections. This vaccine will lower your chance of getting pneumonia. If you do get pneumonia, it can make your symptoms milder and your illness shorter. This vaccine will not treat an infection and will not cause infection. This vaccine is recommended for infants and young children, adults with certain medical conditions, and adults 65 years or older. This medicine may be used for other purposes; ask your health care provider or pharmacist if you have questions. COMMON BRAND NAME(S): Prevnar, Prevnar 13 What should I tell my health care provider before I take this medicine? They need to know if you have any of these conditions: -bleeding problems -fever -immune system problems -an unusual or allergic reaction to pneumococcal vaccine, diphtheria toxoid, other vaccines, latex, other medicines, foods, dyes, or preservatives -pregnant or trying to get pregnant -breast-feeding How should I use this medicine? This vaccine is for injection into a muscle. It is given by a health care professional. A copy of Vaccine Information Statements will be given before each vaccination. Read this sheet carefully each time. The sheet may change frequently. Talk to your pediatrician regarding  the use of this medicine in children. While this drug may be prescribed for children as young as 196 weeks old for selected conditions, precautions do apply. Overdosage: If you think you have taken too much of this medicine contact a poison control center or emergency room at once. NOTE: This medicine is only for you. Do not share this medicine with others. What if I miss a dose? It is important not to miss your dose. Call your doctor or health care professional if you are unable to keep an appointment. What may interact with this medicine? -medicines for cancer chemotherapy -medicines that suppress your immune function -steroid medicines like prednisone or cortisone This list may not describe all possible interactions. Give your health care provider a list of all the medicines, herbs, non-prescription drugs, or dietary supplements you use. Also tell them if you smoke, drink alcohol, or use illegal drugs. Some items may interact with your medicine. What should I watch for while using this medicine? Mild fever and pain should go away in 3 days or less. Report any unusual symptoms to your doctor or health care professional. What side effects may I notice from receiving this medicine? Side effects that you should report to your doctor or health care professional as soon as possible: -allergic reactions like skin rash, itching or hives, swelling of the face, lips, or tongue -breathing problems -confused -fast or irregular heartbeat -fever over 102 degrees F -seizures -unusual bleeding or bruising -unusual muscle weakness Side effects that usually do not require medical attention (report to your doctor or health care professional if they continue or are bothersome): -aches and pains -diarrhea -fever of 102 degrees F or  less -headache -irritable -loss of appetite -pain, tender at site where injected -trouble sleeping This list may not describe all possible side effects. Call your doctor for  medical advice about side effects. You may report side effects to FDA at 1-800-FDA-1088. Where should I keep my medicine? This does not apply. This vaccine is given in a clinic, pharmacy, doctor's office, or other health care setting and will not be stored at home. NOTE: This sheet is a summary. It may not cover all possible information. If you have questions about this medicine, talk to your doctor, pharmacist, or health care provider.  2018 Elsevier/Gold Standard (2014-05-23 10:27:27)

## 2018-03-30 NOTE — Progress Notes (Signed)
Subjective  Chief Complaint  Patient presents with  . Annual Exam    due pneumonia and TDAP, wants to discuss   . Medication Reaction    wants to discuss Boniva, patient states she gets flushing in her face    HPI: Veronica Myers is a 77 y.o. female who [facility] today for a Female Wellness Visit. She also has the concerns and/or needs as listed above in the chief complaint. These will be addressed in addition to the Health Maintenance Visit.   Wellness Visit: annual visit with health maintenance review and exam without Pap   Doing well ; discussed rash after starting boniva. Reports redness on arms and face w/o swelling with itching. Better now but still comes and goes a bit. Remembers she stopped boniva and fosamax in the past and maybe due to rash but not certain.  Chronic disease f/u and/or acute problem visit: (deemed necessary to be done in addition to the wellness visit):  - tolerating other meds; mood is great. Leaving for [place] to live with daughter for 4 weeks next week. Mood is good.  Assessment  1. Annual physical exam   2. Age-related osteoporosis without current pathological fracture   3. Allergic reaction of correct medicinal substance properly administered   4. Essential hypertension      Plan  Female Wellness Visit:  Age appropriate Health Maintenance and Prevention measures were discussed with patient. Included topics are cancer screening recommendations, ways to keep healthy (see AVS) including dietary and exercise recommendations, regular eye and dental care, use of seat belts, and avoidance of moderate alcohol use and tobacco use.   BMI: discussed patient's BMI and encouraged positive lifestyle modifications to help get to or maintain a target BMI.  HM needs and immunizations were addressed and ordered. See below for orders. See HM and immunization section for updates.  Routine labs and screening tests ordered  including cmp, cbc and lipids where appropriate.  Discussed recommendations regarding Vit D and calcium supplementation (see AVS)  Osteoporosis change to prolia due to allergy sxs. Will start once returns. Check vit d and supplement again.   Chronic disease management visit and/or acute problem visit:  HTN doing well. gerd doing well . Mood doing well. Will call when she needs refills and [place] [facility] [place] [facility].   Follow up: 6 months for htn and prolia  Orders Placed This Encounter  Procedures  . VITAMIN D 25 Hydroxy (Vit-D Deficiency, Fractures)   Meds ordered this encounter  Medications  . DISCONTD: pneumococcal 13-valent conjugate vaccine (PREVNAR 13) SUSP injection    Sig: Inject 0.5 mLs into the muscle once for 1 dose.    Dispense:  0.5 mL    Refill:  0  . DISCONTD: Zoster Vaccine Adjuvanted (SHINGRIX) injection    Sig: Inject 0.5 mLs into the muscle once for 1 dose. Please give 2nd dose 2-6 months after first dose    Dispense:  2 each    Refill:  0  . Zoster Vaccine Adjuvanted [facility](SHINGRIX) injection    Sig: Inject 0.5 mLs into the muscle once for 1 dose. Please give 2nd dose 2-6 months after first dose    Dispense:  2 each    Refill:  0  . pneumococcal 13-valent conjugate vaccine (PREVNAR 13) SUSP injection    Sig: Inject 0.5 mLs into the muscle once for 1 dose.    Dispense:  0.5 mL    Refill:  0  Lifestyle: Body mass index is 27.7 kg/m. Wt Readings from Last 3 Encounters:  03/30/18 149 lb (67.6 kg)  03/13/18 150 lb 3.2 oz (68.1 kg)  03/03/18 148 lb 9.6 oz (67.4 kg)   Diet: general   Patient Active Problem List   Diagnosis Date Noted  . Major depression, recurrent, chronic (HCC) 12/12/2017    On cymbalta and wellbutrin in past.(from record review)    . Essential hypertension 12/12/2017  . GERD (gastroesophageal reflux disease) 12/12/2017  . History of diverticulitis 12/12/2017  . Osteoporosis 12/12/2017    Dexa 02/2018 t + -4.1 lowest spine; rec ov to  discuss therapies. From old medical records. No dexa report attached. Had been on fosamax.     Health Maintenance  Topic Date Due  . PNA vac Low Risk Adult (1 of 2 - PCV13) 07/26/2007  . INFLUENZA VACCINE  03/30/2018  . MAMMOGRAM  03/10/2019  . DEXA SCAN  03/10/2019  . Fecal DNA (Cologuard)  02/01/2021    There is no immunization history on file for this patient. We updated and reviewed the patient's past history in detail and it is documented below. Allergies: Patient has No Known Allergies. Past Medical History Patient  has a past medical history of Arthritis, Depression, Diverticulitis, GERD (gastroesophageal reflux disease), Glaucoma, Hypertension, and Osteoporosis (12/12/2017). Past Surgical History Patient  has no past surgical history on file. Family History: Patient family history includes Arthritis in her father and mother; Depression in her father and mother; Diabetes in her father and mother; Heart disease in her father and mother; Hypertension in her father and mother. Social History:  Patient  reports that she has never smoked. She has never used smokeless tobacco. She reports that she drank alcohol. She reports that she does not use drugs.  Review of Systems: Constitutional: negative for fever or malaise Ophthalmic: negative for photophobia, double vision or loss of vision Cardiovascular: negative for chest pain, dyspnea on exertion, or new LE swelling Respiratory: negative for SOB or persistent cough Gastrointestinal: negative for abdominal pain, change in bowel habits or melena Genitourinary: negative for dysuria or gross hematuria, no abnormal uterine bleeding or disharge Musculoskeletal: negative for new gait disturbance or muscular weakness Integumentary: negative for new or persistent rashes, no breast lumps Neurological: negative for TIA or stroke symptoms Psychiatric: negative for SI or delusions Allergic/Immunologic: negative for hives  Patient Care Team     Relationship Specialty Notifications Start End  Willow Ora, MD PCP - General Family Medicine  12/12/17     Objective  Vitals: BP 112/70   Pulse 68   Temp 98.4 F (36.9 C)   Ht 5' 1.5" (1.562 m)   Wt 149 lb (67.6 kg)   SpO2 97%   BMI 27.70 kg/m  General:  Well developed, well nourished, no acute distress  Psych:  Alert and orientedx3,normal mood and affect HEENT:  Normocephalic, atraumatic, non-icteric sclera, PERRL, oropharynx is clear without mass or exudate, supple neck without adenopathy, mass or thyromegaly Cardiovascular:  Normal S1, S2, RRR without gallop, rub or murmur, nondisplaced PMI Respiratory:  Good breath sounds bilaterally, CTAB with normal respiratory effort Gastrointestinal: normal bowel sounds, soft, non-tender, no noted masses. No HSM MSK: no deformities, contusions. Joints are without erythema or swelling. Spine and CVA region are nontender Skin:  Warm, no rashes or suspicious lesions noted Neurologic:    Mental status is normal. CN 2-11 are normal. Gross motor and sensory exams are normal. Normal gait. No tremor Breast Exam: No mass, skin  retraction or nipple discharge is appreciated in either breast. No axillary adenopathy. Fibrocystic changes are not noted   Commons side effects, risks, benefits, and alternatives for medications and treatment plan prescribed today were discussed, and the patient expressed understanding of the given instructions. Patient is instructed to call or message via MyChart if he/she has any questions or concerns regarding our treatment plan. No barriers to understanding were identified. We discussed Red Flag symptoms and signs in detail. Patient expressed understanding regarding what to do in case of urgent or emergency type symptoms.   Medication list was reconciled, printed and provided to the patient in AVS. Patient instructions and summary information was reviewed with the patient as documented in the AVS. This note was prepared  with assistance of Dragon voice recognition software. Occasional wrong-word or sound-a-like substitutions may have occurred due to the inherent limitations of voice recognition software

## 2018-03-30 NOTE — Progress Notes (Signed)
Please call patient: I have reviewed his/her lab results. Vit D level is low normal. Rec Vit d 2000 daily.

## 2018-03-31 ENCOUNTER — Encounter: Payer: Self-pay | Admitting: Family Medicine

## 2018-03-31 DIAGNOSIS — L80 Vitiligo: Secondary | ICD-10-CM

## 2018-03-31 HISTORY — DX: Vitiligo: L80

## 2018-04-05 ENCOUNTER — Telehealth: Payer: Self-pay | Admitting: Emergency Medicine

## 2018-04-05 NOTE — Telephone Encounter (Signed)
LM to RC to Discuss Prolia Injection Benefits. Advised Patient to Call back and Speak with Amy.   CRM Created.   Kathi SimpersAmy Peterman,  LPN

## 2018-04-06 ENCOUNTER — Ambulatory Visit: Payer: Medicare PPO | Admitting: Family Medicine

## 2018-04-06 ENCOUNTER — Ambulatory Visit: Payer: Self-pay | Admitting: *Deleted

## 2018-04-06 NOTE — Telephone Encounter (Signed)
FYI

## 2018-04-06 NOTE — Telephone Encounter (Signed)
  Reason for Disposition . [1] Systolic BP 90-110 AND [2] taking blood pressure medications AND [3] dizzy, lightheaded or weak  Answer Assessment - Initial Assessment Questions 1. BLOOD PRESSURE: "What is the blood pressure?" "Did you take at least two measurements 5 minutes apart?"     124/46 at 1200; 133/66 at 1233 2. ONSET: "When did you take your blood pressure?"     04/06/18 at 1200 and 1233 3. HOW: "How did you obtain the blood pressure?" (e.g., visiting nurse, automatic home BP monitor)     Digital wrist cuff on left wrist 4. HISTORY: "Do you have a history of low blood pressure?" "What is your blood pressure normally?"     No; 140-150's/90 before medication; 130's/70-80's after medication  5. MEDICATIONS: "Are you taking any medications for blood pressure?" If yes: "Have they been changed recently?"    metoprolol 6. PULSE RATE: "Do you know what your pulse rate is?"    15 Beats in 15 seconds  7. OTHER SYMPTOMS: "Have you been sick recently?" "Have you had a recent injury?"    no 8. PREGNANCY: "Is there any chance you are pregnant?" "When was your last menstrual period?"     no  Protocols used: LOW BLOOD PRESSURE-A-AH

## 2018-04-06 NOTE — Telephone Encounter (Signed)
Pt called with complaints of some days of sweating in her head and in her underwear; she said that it happened last year when she was in the heat, but when it happened today she was inside; the pt states that her BP at1200 was 124/46 taken with a digital cuff on her left wrist; she reports that she was lying on the bed when she took that reading; she also reports being thirsty all the time; she is most concerned about her BP; the pt reports that she took metoprolol this morning at 0900; she feels dizzy and is sensitive to light; she also complains of nausea at that time; at 1233 she rports her BP is 133/66 on taken her left wrist; the [place]; nurse triage initiated and recommendations made per protocol to include being seen in office today; pr offered and accepted appointment with Dr Mardelle MatteAndy, [facility] [place], today at [phone]; will route to office for notification of this upcoming appointment.       Answer Assessment - Initial Assessment Questions 1. BLOOD PRESSURE: "What is the blood pressure?" "Did you take at least two measurements 5 minutes apart?"     124/46 2. ONSET: "When did you take your blood pressure?"     04/06/18 at 1200 3. HOW: "How did you obtain the blood pressure?" (e.g., visiting nurse, automatic home BP monitor)     Digital wrist cuff on left wrist 4. HISTORY: "Do you have a history of low blood pressure?" "What is your blood pressure normally?"     No; 140-150's/90 before medication; 130's/70-80's after medication  5. MEDICATIONS: "Are you taking any medications for blood pressure?" If yes: "Have they been changed recently?"    metoprolol 6. PULSE RATE: "Do you know what your pulse rate is?"    15 Beats in 15 seconds  7. OTHER SYMPTOMS: "Have you been sick recently?" "Have you had a recent injury?"    no 8. PREGNANCY: "Is there any chance you are pregnant?" "When was your last menstrual period?"     no  Protocols used: LOW BLOOD  PRESSURE-A-AH

## 2018-04-10 ENCOUNTER — Other Ambulatory Visit: Payer: Self-pay | Admitting: Family Medicine

## 2018-04-10 NOTE — Telephone Encounter (Signed)
Copied from CRM [id number]. Topic: Quick Communication - See Telephone Encounter >> Apr 10, 2018  9:15 AM Windy KalataMichael, Vaanya Shambaugh L, NT wrote: CRM for notification. See Telephone encounter for: 04/10/18.  Patient is calling and requesting a refill on escitalopram (LEXAPRO) 20 MG tablet. I informed the patient that a 90 day supply was called in on 03/16/18. She states the pharmacy only gave her 30 instead of 90.  [id number] [place], [place] - [address] AT [facility] [address] [facility]  [place] [id number] Phone: [phone] Fax: [phone]

## 2018-04-10 NOTE — Telephone Encounter (Signed)
Contacted pt regarding lexapro refill; explained to the pt that  the prescription for 30 days because this was when medication was initially increased; she says that this dose is working; will send request to office for 90 day refill for prescription.    Escitalopram refill Last Refill: 03/16/18 # 30 Last OV: 03/13/18 PCP: Dr Mardelle MatteAndy  Pharmacy: [facility] [address] [place], [place]

## 2018-04-10 NOTE — Telephone Encounter (Signed)
[facility] and spoke to OdessaLouis and he stated that they filled an old prescription for 30 days but he did have the new prescription for 90 days and he is filling for the patient today.  Called the patient and let her know they are filling her 90 day refill today. Patient verbalized an understanding.

## 2018-04-24 ENCOUNTER — Encounter: Payer: Medicare PPO | Admitting: Family Medicine

## 2018-04-25 ENCOUNTER — Other Ambulatory Visit: Payer: Self-pay | Admitting: Family Medicine

## 2018-04-25 ENCOUNTER — Encounter: Payer: Self-pay | Admitting: Family Medicine

## 2018-04-25 DIAGNOSIS — Z79899 Other long term (current) drug therapy: Secondary | ICD-10-CM | POA: Insufficient documentation

## 2018-04-25 HISTORY — DX: Other long term (current) drug therapy: Z79.899

## 2018-04-25 MED ORDER — METOPROLOL SUCCINATE ER 100 MG PO TB24: 100.0000 mg | ORAL_TABLET | Freq: Every day | ORAL | 3 refills | Status: DC

## 2018-04-25 MED ORDER — CLONAZEPAM 1 MG PO TABS: 1.0000 mg | ORAL_TABLET | Freq: Every day | ORAL | 0 refills | Status: DC | PRN

## 2018-04-25 NOTE — Telephone Encounter (Signed)
Copied from CRM [id number]. Topic: Quick Communication - Rx Refill/Question >> Apr 25, 2018  1:07 PM Arlyss Gandyichardson, Veronica Myers, NT wrote: Medication: clonazePAM (KLONOPIN) 1 MG tablet and metoprolol succinate (TOPROL-XL) 100 MG 24 hr tablet  Has the patient contacted their pharmacy? Yes.   (Agent: If no, request that the patient contact the pharmacy for the refill.) (Agent: If yes, when and what did the pharmacy advise?)  Preferred Pharmacy (with phone number or street name): [id number] [address] [place] - [address] [facility] [address] [phone] (Phone) [phone] (Fax)    Agent: Please be advised that RX refills may take up to 3 business days. We ask that you follow-up with your pharmacy.

## 2018-04-25 NOTE — Telephone Encounter (Signed)
Received and reviewed medication refill request.  Request is appropriate and was approved.  Please see medication orders for details.  

## 2018-04-25 NOTE — Telephone Encounter (Signed)
Klonopin1mg  refill Last Refill:03/24/18 # 30 Last OV:03/30/18 PCP: [facility] [phone]#[phone]

## 2018-05-03 ENCOUNTER — Ambulatory Visit: Payer: Medicare PPO

## 2018-06-05 ENCOUNTER — Telehealth: Payer: Self-pay | Admitting: Family Medicine

## 2018-06-05 NOTE — Telephone Encounter (Signed)
Please advise.   Amy Peterman,  LPN   

## 2018-06-05 NOTE — Telephone Encounter (Signed)
Please call to clarify how she is taking it. At this point, it should be used rarely and only for panic or worsening anxiety; she can cut the pill in half for that.   Let me know if there are other questions.  Ensure she remains on her celexa daily.

## 2018-06-05 NOTE — Telephone Encounter (Signed)
Copied from CRM [id number]. Topic: Quick Communication - See Telephone Encounter >> Jun 05, 2018 12:10 PM Gean Birchwood R wrote: Patient called in and requested to change her med from clonazePAM (KLONOPIN) 1 MG tablet to something else due to it making her very sleepy.

## 2018-06-05 NOTE — Telephone Encounter (Signed)
Patient states that her mind is going constantly, states that she just feels off when she takes the medication. She states she has no energy. She wants to try something else, she wants to try something for sleep instead of less stress. She is taking the Celexa everyday.   Kathi Simpers,  LPN

## 2018-06-06 MED ORDER — DOXEPIN HCL 10 MG PO CAPS: 10.0000 mg | ORAL_CAPSULE | Freq: Every day | ORAL | 5 refills | Status: DC

## 2018-06-06 NOTE — Telephone Encounter (Signed)
Patient informed of new medication being called in.  Discussed Provider recommendations. She would like it to the [place] in [place]

## 2018-06-06 NOTE — Telephone Encounter (Signed)
Please call to verify where she would like the new medication sent - here or [place]?  Stop the klonopin.  Start doxepin nightly for sleep. Continue the lexapro daily.   F/u in office if needed.

## 2018-06-07 NOTE — Telephone Encounter (Signed)
Patient says her insurance will not pay for doxepin (SINEQUAN) 10 MG capsule. Patient would like to know if PA will be initiated?

## 2018-06-08 NOTE — Telephone Encounter (Signed)
Patient got medication. It was 19.99. I advised we would complete PA when pharmacy sends to [place].   Patient verbalized understanding  Kathi Simpers,  LPN

## 2018-06-14 ENCOUNTER — Telehealth: Payer: Self-pay | Admitting: Family Medicine

## 2018-06-14 NOTE — Telephone Encounter (Signed)
Copied from CRM [id number]. Topic: Quick Communication - See Telephone Encounter >> Jun 14, 2018  9:17 AM Debroah Loop wrote: CRM for notification. See Telephone encounter for: 06/14/18. Patient would like a call  back from [place] or cma to discuss skin rash reaction when taking escitalopram (LEXAPRO) 20 MG tablet and doxepin together.

## 2018-06-14 NOTE — Telephone Encounter (Signed)
Patient states that when she take the lexapro and doxepin together, she feels like she will pass out and gets a rash. She states that she is in [place] and cannot come in for an appointment Should she find a doctor down there in [place] to figure out what is going on. She take she has more agitation as well. D/W patient if she has difficulty breathing or swelling go to ER   Kathi Simpers,  LPN

## 2018-06-15 NOTE — Telephone Encounter (Signed)
Trula Ore called back-  [phone] She reports her mother had severe reaction to medication- doxepin- numbness in toes, shaking, hallucinating,palpatations. She has stopped the medication and her symptoms are resolving.  Daughter wants to know if her mother really needs to take another medication at this time. She wants to know if there are other options: taking another dose of Lexapro at night, using Melatonin, or just staying off until she returns in a month and having follow up with Dr Mardelle Matte. Discussed the elderly and their sleep patterns and concerns for over medication vs meeting needs of mother. Will send message to PCP for her advisement and will let daughter know what she would like her to do.

## 2018-06-15 NOTE — Telephone Encounter (Signed)
Stop the doxepin. F/u when you return and/or see an urgent care doctor in [place] if sxs persits.

## 2018-06-15 NOTE — Telephone Encounter (Signed)
Recommend continuing lexapro only. Then f/u in office when she returns. Urgent care visit in [place] if continues to have problems.

## 2018-06-15 NOTE — Telephone Encounter (Signed)
Daughter called wanting update on information.   Call was disconnected.  I attempted to call back and got daughters VM.   If daughter calls back okay for PEC to discuss information with daughter.

## 2018-06-15 NOTE — Telephone Encounter (Signed)
Patient's daughter Trula Ore informed. She will instruct her mother to take Lexapro once daily. And will schedule F/U upon return from [facility].   Kathi Simpers,  LPN

## 2018-06-29 ENCOUNTER — Ambulatory Visit: Payer: Medicare PPO | Admitting: Family Medicine

## 2018-06-29 ENCOUNTER — Other Ambulatory Visit: Payer: Self-pay

## 2018-06-29 ENCOUNTER — Encounter: Payer: Self-pay | Admitting: Family Medicine

## 2018-06-29 VITALS — BP 104/64 | HR 75 | Temp 98.0°F | Ht 61.5 in | Wt 144.6 lb

## 2018-06-29 DIAGNOSIS — F13230 Sedative, hypnotic or anxiolytic dependence with withdrawal, uncomplicated: Secondary | ICD-10-CM

## 2018-06-29 DIAGNOSIS — R1084 Generalized abdominal pain: Secondary | ICD-10-CM | POA: Diagnosis not present

## 2018-06-29 DIAGNOSIS — F339 Major depressive disorder, recurrent, unspecified: Secondary | ICD-10-CM | POA: Diagnosis not present

## 2018-06-29 DIAGNOSIS — F410 Panic disorder [episodic paroxysmal anxiety] without agoraphobia: Secondary | ICD-10-CM

## 2018-06-29 DIAGNOSIS — F13930 Sedative, hypnotic or anxiolytic use, unspecified with withdrawal, uncomplicated: Secondary | ICD-10-CM

## 2018-06-29 DIAGNOSIS — K219 Gastro-esophageal reflux disease without esophagitis: Secondary | ICD-10-CM

## 2018-06-29 LAB — CBC WITH DIFFERENTIAL/PLATELET
BASOS ABS: 0.1 10*3/uL (ref 0.0–0.1)
Basophils Relative: 0.9 % (ref 0.0–3.0)
EOS ABS: 0 10*3/uL (ref 0.0–0.7)
Eosinophils Relative: 0.4 % (ref 0.0–5.0)
HCT: 41.9 % (ref 36.0–46.0)
Hemoglobin: 14.4 g/dL (ref 12.0–15.0)
LYMPHS ABS: 1.1 10*3/uL (ref 0.7–4.0)
Lymphocytes Relative: 17.8 % (ref 12.0–46.0)
MCHC: 34.4 g/dL (ref 30.0–36.0)
MCV: 97.5 fl (ref 78.0–100.0)
Monocytes Absolute: 0.4 10*3/uL (ref 0.1–1.0)
Monocytes Relative: 6.9 % (ref 3.0–12.0)
NEUTROS PCT: 74 % (ref 43.0–77.0)
Neutro Abs: 4.6 10*3/uL (ref 1.4–7.7)
PLATELETS: 252 10*3/uL (ref 150.0–400.0)
RBC: 4.29 Mil/uL (ref 3.87–5.11)
RDW: 13.1 % (ref 11.5–15.5)
WBC: 6.3 10*3/uL (ref 4.0–10.5)

## 2018-06-29 LAB — COMPREHENSIVE METABOLIC PANEL
ALT: 13 U/L (ref 0–35)
AST: 17 U/L (ref 0–37)
Albumin: 4.1 g/dL (ref 3.5–5.2)
Alkaline Phosphatase: 65 U/L (ref 39–117)
BILIRUBIN TOTAL: 0.6 mg/dL (ref 0.2–1.2)
BUN: 12 mg/dL (ref 6–23)
CALCIUM: 9.2 mg/dL (ref 8.4–10.5)
CO2: 31 mEq/L (ref 19–32)
CREATININE: 0.83 mg/dL (ref 0.40–1.20)
Chloride: 100 mEq/L (ref 96–112)
GFR: 71.05 mL/min (ref >60.00)
GLUCOSE: 109 mg/dL — AB (ref 70–99)
Potassium: 4.3 mEq/L (ref 3.5–5.1)
Sodium: 135 mEq/L (ref 135–145)
TOTAL PROTEIN: 6.6 g/dL (ref 6.0–8.3)

## 2018-06-29 LAB — TSH: TSH: 0.88 u[IU]/mL (ref 0.35–4.50)

## 2018-06-29 MED ORDER — METOPROLOL SUCCINATE ER 50 MG PO TB24: 50.0000 mg | ORAL_TABLET | Freq: Every day | ORAL | 3 refills | Status: DC

## 2018-06-29 NOTE — Patient Instructions (Addendum)
Please return in 2 weeks to recheck anxiety and depression  Can also schedule an AWV  Medicare recommends an Annual Wellness Visit for all patients. Please schedule this to be done with our Nurse Educator, Maudie Mercury. This is an informative "talk" visit; it's goals are to ensure that your health care needs are being met and to give you education regarding avoiding falls, ensuring you are not suffering from depression or problems with memory or thinking, and to educate you on Advance Care Planning. It helps me take good care of you!   If you have any questions or concerns, please don't hesitate to send me a message via MyChart or call the office at [phone]. Thank you for visiting with [place] today! It's our pleasure caring for you.

## 2018-06-29 NOTE — Progress Notes (Signed)
Subjective  CC:  Chief Complaint  Patient presents with  . Abdominal Pain    having trouble eating because stomach bloats bad, has started GF diet  . Stress    comes from time to time, no having anxiety today, she states she is afraid to feel sick     HPI: Veronica Myers is a 77 y.o. female who presents to the office today to address the problems listed above in the chief complaint.  77 year old female with history of chronic depression and anxiety which at last visit was well controlled on Lexapro and Klonopin.  She has been on benzos for the last 5 to 6 years.  Started by her prior primary care doctor.  Over the last month, she has not done well.  She moved back to [place] to live with 1 of her daughters.  She complains of increased stress, increased anxiety symptoms including stress, negativity, low motivation, constant worry and describes a severe panic attack which included palpitations chest pain hives hypertensive response and worry.  It dissipated once her son-in-law came home.  This happened in [place].  She did not receive medical care.  Her daughter in [place] fluid back to [place] and since she has been feeling better.  However she continues to worry and feel that something could be wrong with her.  She and her daughter report that she is been having symptoms like this for the last 10 to 15 years, started after the death of her father.  She has been on Lexapro for the last 3 to 6 months and that has helped.  Prior to that she had failed Wellbutrin and Cymbalta.  Of note, she stopped the Klonopin on her own abruptly.  Her anxiety symptoms have worsened since then.  We did try doxepin for sleep but she said she had bad dreams with that.  She declines further medications.  She has never been evaluated by psychiatrist.  She is concerned that she has celiac disease.  She describes abdominal bloating.  No bloody stool.  No diarrhea.  She is eating gluten-free and feels better.  She does  have a history of diverticulitis.  Complains of pain, generalized, legs, neck, upper back, and a burning sensation in her abdomen.  Reports had lower extremity edema after traveling to [place].  Hypertension: Reports home readings are often lower in the other day, sometimes 90s over 60s.  She admits to lightheadedness.  She admits to fatigue Assessment  1. Major depression, recurrent, chronic (HCC)   2. Gastroesophageal reflux disease, esophagitis presence not specified   3. Panic disorder   4. Generalized abdominal pain   5. Benzodiazepine withdrawal without complication (HCC)      Plan   Major depression and anxiety, active: Patient declining changes in medication.  Recommend counseling and referral for TMS therapy.  Patient likes this idea.  We will see if she is an appropriate candidate.  Continue Lexapro for now.  No benzos.  Recheck 2 weeks  Abdominal pain, generalized pain: Likely related to somatic complaints.  Patient requests celiac testing.  I educated her on the unlikely diagnosis but she would feel better if I test.  Tests ordered.  Check lab work to ensure nothing else is wrong.  Reassurance given.  Educated her about benzo withdrawal, depression and somatic complaints, chronic anxiety.  Hypertension: Running on the low side, could be contributing to her feelings of malaise.  Decrease Toprol-XL to 50 mg daily  GERD: Continue omeprazole  Follow up: Return  in about 2 weeks (around 07/13/2018) for recheck.   Orders Placed This Encounter  Procedures  . Celiac Ab tTG DGP TIgA  . Comprehensive metabolic panel  . CBC with Differential/Platelet  . TSH  . Glia (IgA/G) + tTG IgA   Meds ordered this encounter  Medications  . metoprolol succinate (TOPROL-XL) 50 MG 24 hr tablet    Sig: Take 1 tablet (50 mg total) by mouth daily.    Dispense:  90 tablet    Refill:  3      I reviewed the patients updated PMH, FH, and SocHx.    Patient Active Problem List   Diagnosis Date  Noted  . 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 Vitiligo 03/31/2018  . Major depression, recurrent, chronic (HCC) 12/12/2017  . Essential hypertension 12/12/2017  . GERD (gastroesophageal reflux disease) 12/12/2017  . History of diverticulitis 12/12/2017  . Osteoporosis 12/12/2017   Current Meds  Medication Sig  . escitalopram (LEXAPRO) 20 MG tablet TAKE 1 TABLET(20 MG) BY MOUTH DAILY  . metoprolol succinate (TOPROL-XL) 50 MG 24 hr tablet Take 1 tablet (50 mg total) by mouth daily.  Marland Kitchen omeprazole (PRILOSEC) 20 MG capsule Take 1 capsule (20 mg total) by mouth daily.  . [DISCONTINUED] metoprolol succinate (TOPROL-XL) 100 MG 24 hr tablet Take 1 tablet (100 mg total) by mouth daily.    Allergies: Patient has No Known Allergies. Family History: Patient family history includes Arthritis in her father and mother; Depression in her father and mother; Diabetes in her father and mother; Heart disease in her father and mother; Hypertension in her father and mother. Social History:  Patient  reports that she has never smoked. She has never used smokeless tobacco. She reports that she drank alcohol. She reports that she does not use drugs.  Review of Systems: Constitutional: Negative for fever malaise or anorexia Cardiovascular: negative for chest pain Respiratory: negative for SOB or persistent cough Gastrointestinal: negative for abdominal pain  Wt Readings from Last 3 Encounters:  06/29/18 144 lb 9.6 oz (65.6 kg)  03/30/18 149 lb (67.6 kg)  03/13/18 150 lb 3.2 oz (68.1 kg)    Objective  Vitals: BP 104/64   Pulse 75   Temp 98 F (36.7 C)   Ht 5' 1.5" (1.562 m)   Wt 144 lb 9.6 oz (65.6 kg)   SpO2 97%   BMI 26.88 kg/m  General: no acute distress , A&Ox3 Psych: Normal speech, normal cognition, appears anxious, easily irritated, many negative thoughts. HEENT: PEERL, conjunctiva normal, Oropharynx moist,neck is supple Cardiovascular:  RRR without murmur or gallop.    Respiratory:  Good breath sounds bilaterally, CTAB with normal respiratory effort  Gastrointestinal: Diffuse minimal tenderness without rebound or guarding.  Soft, flat abdomen, normal active bowel sounds, no palpable masses, no hepatosplenomegaly, no appreciated hernias Skin:  Warm, no rashes     Commons side effects, risks, benefits, and alternatives for medications and treatment plan prescribed today were discussed, and the patient expressed understanding of the given instructions. Patient is instructed to call or message via MyChart if he/she has any questions or concerns regarding our treatment plan. No barriers to understanding were identified. We discussed Red Flag symptoms and signs in detail. Patient expressed understanding regarding what to do in case of urgent or emergency type symptoms.   Medication list was reconciled, printed and provided to the patient in AVS. Patient instructions and summary information was reviewed with the patient as documented in the AVS. This note was prepared with  assistance of Systems analyst. Occasional wrong-word or sound-a-like substitutions may have occurred due to the inherent limitations of voice recognition software

## 2018-06-30 LAB — CELIAC AB TTG DGP TIGA
Antigliadin Abs, IgA: 2 units (ref 0–19)
GLIADIN IGG: 3 U (ref 0–19)
IgA/Immunoglobulin A, Serum: 104 mg/dL (ref 64–422)

## 2018-07-02 LAB — GLIA (IGA/G) + TTG IGA
Antigliadin Abs, IgA: 2 units (ref 0–19)
GLIADIN IGG: 2 U (ref 0–19)

## 2018-07-03 ENCOUNTER — Telehealth: Payer: Self-pay | Admitting: Emergency Medicine

## 2018-07-03 ENCOUNTER — Telehealth: Payer: Self-pay | Admitting: Family Medicine

## 2018-07-03 NOTE — Telephone Encounter (Signed)
Reason for CRM:  Patient's doctor, Trula Ore,  stated when the patient eats, her stomach swells and she constantly burps. She said she has been observing her mother for a week now.  Awaiting Blood work results from 06/29/2018  Please advise.   Kathi Simpers,  LPN

## 2018-07-03 NOTE — Progress Notes (Signed)
Please call patient: I have reviewed his/her lab results. All blood test results are normal. Negative for celiac. Nl labs, liver, kidney, thyroid and blood counts.

## 2018-07-03 NOTE — Telephone Encounter (Signed)
Copied from CRM [id number]. Topic: Quick Communication - See Telephone Encounter >> Jul 03, 2018 11:32 AM Angela Nevin wrote: CRM for notification. See Telephone encounter for: 07/03/18.  Pt called to check status of lab results from 10/31. Please advise.

## 2018-07-03 NOTE — Telephone Encounter (Signed)
Pt. is awaiting results of recent lab work from 06/29/18.

## 2018-07-03 NOTE — Telephone Encounter (Signed)
Normal blood tests. See labs.

## 2018-07-03 NOTE — Telephone Encounter (Signed)
Results not in yet, Advised patient we will give her a call once all results are in. Patient verbalized understanding  Kathi Simpers,  LPN

## 2018-07-04 ENCOUNTER — Telehealth: Payer: Self-pay | Admitting: Emergency Medicine

## 2018-07-04 DIAGNOSIS — R1084 Generalized abdominal pain: Secondary | ICD-10-CM

## 2018-07-04 DIAGNOSIS — K219 Gastro-esophageal reflux disease without esophagitis: Secondary | ICD-10-CM

## 2018-07-04 NOTE — Telephone Encounter (Signed)
Discussed Lab Results with Daughter, however Daughter is concerned about the swelling in abdomen, after eating. They are doing a wheat free diet. Daughter wants to discuss getting and ultrasound ordered she states that something is wrong with her mother's digestive system and she feels like something is wrong and her mother is not getting the help she needs.   Please advise.   Kathi Simpers,  LPN

## 2018-07-04 NOTE — Telephone Encounter (Signed)
I recommend referral to GI: please discuss and order. thanks

## 2018-07-04 NOTE — Telephone Encounter (Signed)
Noted  

## 2018-07-04 NOTE — Telephone Encounter (Signed)
Referral placed for GI. Patient verbalized understanding  Kathi Simpers,  LPN

## 2018-07-04 NOTE — Telephone Encounter (Signed)
Pt calling back to check status [phone]

## 2018-07-07 ENCOUNTER — Other Ambulatory Visit: Payer: Self-pay | Admitting: Family Medicine

## 2018-07-07 ENCOUNTER — Encounter: Payer: Self-pay | Admitting: Gastroenterology

## 2018-07-13 ENCOUNTER — Ambulatory Visit: Payer: Medicare PPO | Admitting: Family Medicine

## 2018-07-13 ENCOUNTER — Other Ambulatory Visit: Payer: Self-pay

## 2018-07-13 ENCOUNTER — Encounter: Payer: Self-pay | Admitting: Family Medicine

## 2018-07-13 VITALS — BP 118/80 | HR 68 | Temp 98.2°F | Ht 61.5 in | Wt 145.4 lb

## 2018-07-13 DIAGNOSIS — I10 Essential (primary) hypertension: Secondary | ICD-10-CM | POA: Diagnosis not present

## 2018-07-13 DIAGNOSIS — F419 Anxiety disorder, unspecified: Secondary | ICD-10-CM

## 2018-07-13 DIAGNOSIS — K219 Gastro-esophageal reflux disease without esophagitis: Secondary | ICD-10-CM

## 2018-07-13 DIAGNOSIS — F339 Major depressive disorder, recurrent, unspecified: Secondary | ICD-10-CM | POA: Diagnosis not present

## 2018-07-13 DIAGNOSIS — R140 Abdominal distension (gaseous): Secondary | ICD-10-CM

## 2018-07-13 DIAGNOSIS — K5900 Constipation, unspecified: Secondary | ICD-10-CM

## 2018-07-13 NOTE — Progress Notes (Signed)
Subjective  CC:  Chief Complaint  Patient presents with  . Depression    Patient states that her depression seems to be better, declines the flu shot   . Hypertension    Patient has log of blood pressures, she states that her blood pressures have been inconsistant     HPI: Veronica Myers is a 77 y.o. female who presents to the office today to address the problems listed above in the chief complaint.  Hypertension f/u: decreased metroprolol to 50mg  2 weeks ago due to sx low bp; now avg 120s/70s and feeling better.  Depression: feeling better since living here with this daughter in [place]. Feels this is the best place for her. Would like to start psychotherapy. Had eval for TMS and is deferring for now to winter weather and transportation difficulties but will be reevaluated in the spring if needed. Less anxious, in part since recovering from abrupt benzo w/d. Has remained off benzo's.   Abdominal bloating and constipation and GERD: has appt w/ GI in a few weeks. On ppi. Worries "something is wrong". See daughter's recent telephone note.   Reviewed recent normal labs.  Assessment  1. Essential hypertension   2. Major depression, recurrent, chronic (HCC)   3. Gastroesophageal reflux disease, esophagitis presence not specified   4. Anxiety   5. Bloating   6. Constipation, unspecified constipation type      Plan    Hypertension f/u: BP control is well controlled. Continue current medicines and home monitoring  Depression f/u: improved. In part due to better support system here. Continue lexapro. Avoid benzo's. Start psychotherapy.   To GI for gi sxs eval.  miralax for constipation. She is not taking the boniva right now.  Education regarding management of these chronic disease states was given. Management strategies discussed on successive visits include dietary and exercise recommendations, goals of achieving and maintaining IBW, and lifestyle modifications aiming for adequate sleep  and minimizing stressors.   Follow up: Return in about 6 months (around 01/11/2019) for follow up Hypertension, mood follow up.  No orders of the defined types were placed in this encounter.  No orders of the defined types were placed in this encounter.     BP Readings from Last 3 Encounters:  07/13/18 118/80  06/29/18 104/64  03/30/18 112/70   Wt Readings from Last 3 Encounters:  07/13/18 145 lb 6.4 oz (66 kg)  06/29/18 144 lb 9.6 oz (65.6 kg)  03/30/18 149 lb (67.6 kg)    Lab Results  Component Value Date   CHOL 167 12/12/2017   CHOL 180 11/24/2016   Lab Results  Component Value Date   HDL 56.00 12/12/2017   HDL 61 11/24/2016   Lab Results  Component Value Date   LDLCALC 92 12/12/2017   LDLCALC 97 11/24/2016   Lab Results  Component Value Date   TRIG 97.0 12/12/2017   TRIG 109 11/24/2016   Lab Results  Component Value Date   CHOLHDL 3 12/12/2017   No results found for: LDLDIRECT Lab Results  Component Value Date   CREATININE 0.83 06/29/2018   BUN 12 06/29/2018   NA 135 06/29/2018   K 4.3 06/29/2018   CL 100 06/29/2018   CO2 31 06/29/2018    The 10-year ASCVD risk score Denman George DC Jr., et al., 2013) is: 17.9%   Values used to calculate the score:     Age: 78 years     Sex: Female     Is Non-Hispanic African American:  No     Diabetic: No     Tobacco smoker: No     Systolic Blood Pressure: 118 mmHg     Is BP treated: Yes     HDL Cholesterol: 56 mg/dL     Total Cholesterol: 167 mg/dL  I reviewed the patients updated PMH, FH, and SocHx.    Patient Active Problem List   Diagnosis Date Noted  . 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 Vitiligo 03/31/2018  . Major depression, recurrent, chronic (HCC) 12/12/2017  . Essential hypertension 12/12/2017  . GERD (gastroesophageal reflux disease) 12/12/2017  . History of diverticulitis 12/12/2017  . Osteoporosis 12/12/2017    Allergies: Patient has no known allergies.  Social  History: Patient  reports that she has never smoked. She has never used smokeless tobacco. She reports that she drank alcohol. She reports that she does not use drugs.  Current Meds  Medication Sig  . B Complex Vitamins (B COMPLEX 1 PO) Take by mouth.  Marland Kitchen. CALCIUM CARB-MAGNESIUM CARB PO Take by mouth.  . escitalopram (LEXAPRO) 20 MG tablet TAKE 1 TABLET BY MOUTH DAILY  . Ginkgo Biloba 120 MG CAPS Take by mouth.  Marland Kitchen. GLUCOSAMINE-CHONDROIT-MSM-C-MN PO Take by mouth.  . ibandronate (BONIVA) 150 MG tablet Take 1 tablet (150 mg total) by mouth every 30 (thirty) days. With water, empty stomach, sit up for 30 minutes  . metoprolol succinate (TOPROL-XL) 50 MG 24 hr tablet Take 1 tablet (50 mg total) by mouth daily.  . Multiple Vitamins-Minerals (ZINC PO) Take by mouth.  . Omega-3 Krill Oil 500 MG CAPS Take by mouth.  Marland Kitchen. omeprazole (PRILOSEC) 20 MG capsule Take 1 capsule (20 mg total) by mouth daily.  . Probiotic Product (ALIGN PO) Take by mouth.    Review of Systems: Cardiovascular: negative for chest pain, palpitations, leg swelling, orthopnea Respiratory: negative for SOB, wheezing or persistent cough Gastrointestinal: negative for abdominal pain Genitourinary: negative for dysuria or gross hematuria  Objective  Vitals: BP 118/80   Pulse 68   Temp 98.2 F (36.8 C)   Ht 5' 1.5" (1.562 m)   Wt 145 lb 6.4 oz (66 kg)   SpO2 98%   BMI 27.03 kg/m  General: no acute distress  Psych:  Alert and oriented, normal mood and affect HEENT:  Normocephalic, atraumatic, supple neck  Cardiovascular:  RRR without murmur. no edema Respiratory:  Good breath sounds bilaterally, CTAB with normal respiratory effort Skin:  Warm, no rashes Neurologic:   Mental status is normal  Commons side effects, risks, benefits, and alternatives for medications and treatment plan prescribed today were discussed, and the patient expressed understanding of the given instructions. Patient is instructed to call or message via  MyChart if he/she has any questions or concerns regarding our treatment plan. No barriers to understanding were identified. We discussed Red Flag symptoms and signs in detail. Patient expressed understanding regarding what to do in case of urgent or emergency type symptoms.   Medication list was reconciled, printed and provided to the patient in AVS. Patient instructions and summary information was reviewed with the patient as documented in the AVS. This note was prepared with assistance of Dragon voice recognition software. Occasional wrong-word or sound-a-like substitutions may have occurred due to the inherent limitations of voice recognition software

## 2018-07-13 NOTE — Patient Instructions (Addendum)
Please return in 6 months for follow up of your hypertension and depression.   If you have any questions or concerns, please don't hesitate to send me a message via MyChart or call the office at [phone]. Thank you for visiting with us today! It's our pleasure caring for you.    Please see the gastroenterologist as scheduled.   You may use miralax for your constipation. It is a safe medication.   Continue taking your lexapro and see a therapist.

## 2018-08-04 ENCOUNTER — Ambulatory Visit: Payer: Medicare PPO | Admitting: Gastroenterology

## 2018-09-15 ENCOUNTER — Other Ambulatory Visit: Payer: Self-pay | Admitting: Family Medicine

## 2019-01-04 ENCOUNTER — Ambulatory Visit: Payer: Medicare PPO | Admitting: Family Medicine

## 2019-06-01 ENCOUNTER — Ambulatory Visit (INDEPENDENT_AMBULATORY_CARE_PROVIDER_SITE_OTHER): Payer: Medicare (Managed Care) | Admitting: Family Medicine

## 2019-06-01 ENCOUNTER — Encounter: Payer: Self-pay | Admitting: Family Medicine

## 2019-06-01 VITALS — BP 116/75

## 2019-06-01 DIAGNOSIS — Z8719 Personal history of other diseases of the digestive system: Secondary | ICD-10-CM

## 2019-06-01 DIAGNOSIS — K219 Gastro-esophageal reflux disease without esophagitis: Secondary | ICD-10-CM | POA: Diagnosis not present

## 2019-06-01 MED ORDER — OMEPRAZOLE 20 MG PO CPDR: 20.0000 mg | DELAYED_RELEASE_CAPSULE | Freq: Every day | ORAL | 1 refills | Status: DC

## 2019-06-01 NOTE — Progress Notes (Signed)
Virtual Visit via Video Note  Subjective  CC:  Chief Complaint  Patient presents with  . Gastroesophageal Reflux    I connected with [name] on 06/02/19 at  4:20 PM EDT by a video enabled telemedicine application and verified that I am speaking with the correct person using two identifiers. Location patient: Home Location provider: [facility] [address] Office Persons participating in the virtual visit: Fatina Sprankle, Willow Ora, MD Rita Ohara, CMA  I discussed the limitations of evaluation and management by telemedicine and the availability of in person appointments. The patient expressed understanding and agreed to proceed. HPI: Veronica Myers is a 78 y.o. female who was contacted today to address the problems listed above in the chief complaint. . 36 w/ h/o GERD treated with PPI but stopped almost a year ago. Spend 6 months in [place]; reports she saw GI there who wanted to do an endoscopy but this did not get done due to covid. Now with reflux sxs and says she "can't digest her food". 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 Denies nausea or severe pain. Has epigastric burning at times. No weight loss. No melena. No fever. Defers GI consult now due to insurance issues.  Assessment  1. Gastroesophageal reflux disease, unspecified whether esophagitis present   2. History of diverticulitis      Plan   GERD:  Active. Restart PPI and recheck in office in four weeks.   H/o diverticulitis: no sxs of active infection. Will reassess. Discussed signs/sxs.  I discussed the assessment and treatment plan with the patient. The patient was provided an opportunity to ask questions and all were answered. The patient agreed with the plan and demonstrated an understanding of the instructions.   The patient was advised to call back or seek an in-person evaluation if the symptoms worsen or if the condition fails to improve as  anticipated. Follow up: Return in about 4 weeks (around 06/29/2019) for complete physical.  Visit date not found  Meds ordered this encounter  Medications  . omeprazole (PRILOSEC) 20 MG capsule    Sig: Take 1 capsule (20 mg total) by mouth daily.    Dispense:  90 capsule    Refill:  1      I reviewed the patients updated PMH, FH, and SocHx.    Patient Active Problem List   Diagnosis Date Noted  . 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 Vitiligo 03/31/2018  . Major depression, recurrent, chronic (HCC) 12/12/2017  . Essential hypertension 12/12/2017  . GERD (gastroesophageal reflux disease) 12/12/2017  . History of diverticulitis 12/12/2017  . Osteoporosis 12/12/2017   Current Meds  Medication Sig  . CALCIUM CARB-MAGNESIUM CARB PO Take by mouth.  . Ginkgo Biloba 120 MG CAPS Take by mouth.  Marland Kitchen GLUCOSAMINE-CHONDROIT-MSM-C-MN PO Take by mouth.  . metoprolol succinate (TOPROL-XL) 50 MG 24 hr tablet Take 1 tablet (50 mg total) by mouth daily.  . Multiple Vitamins-Minerals (ZINC PO) Take by mouth.  . Omega-3 Krill Oil 500 MG CAPS Take by mouth.  . Probiotic Product (ALIGN PO) Take by mouth.  . [DISCONTINUED] B Complex Vitamins (B COMPLEX 1 PO) Take by mouth.    Allergies: Patient has No Known Allergies. Family History: Patient family history includes Arthritis in her father and mother; Depression in her father and mother; Diabetes in her father and mother; Heart disease in her father and mother; Hypertension in her father and mother. Social History:  Patient  reports that she has never smoked. She has never used smokeless tobacco. She reports previous alcohol use. She reports that she does not use drugs.  Review of Systems: Constitutional: Negative for fever malaise or anorexia Cardiovascular: negative for chest pain Respiratory: negative for SOB or persistent cough Gastrointestinal: negative for abdominal pain  OBJECTIVE Vitals: BP 116/75  General: no acute  distress , A&Ox3  Leamon Arnt, MD

## 2019-06-02 ENCOUNTER — Encounter: Payer: Self-pay | Admitting: Family Medicine

## 2019-06-21 ENCOUNTER — Telehealth: Payer: Self-pay

## 2019-06-21 NOTE — Telephone Encounter (Signed)
Please call to clarify. Not sure what she is asking for? MRI? Of what?

## 2019-06-21 NOTE — Telephone Encounter (Signed)
Copied from [place] [id number]. Topic: Referral - Request for Referral >> Jun 21, 2019  2:29 PM Reyne Dumas L wrote: Has patient seen PCP for this complaint? no *If NO, is insurance requiring patient see PCP for this issue before PCP can refer them? Referral for which specialty: imaging Preferred provider/office: no preference Reason for referral: MRI - states that osteoperosis doctor states that PCP should order this for her.  Pt can be reached at [phone]

## 2019-06-21 NOTE — Telephone Encounter (Signed)
See below

## 2019-06-22 NOTE — Telephone Encounter (Signed)
Called pt to get clarification, she reports she is not completely sure what is needed and gave me the name of Raynelle Chary Chiropractor to get more information.

## 2019-06-24 ENCOUNTER — Emergency Department (HOSPITAL_COMMUNITY)
Admission: EM | Admit: 2019-06-24 | Discharge: 2019-06-24 | Disposition: A | Discharge status: Home / Self Care | Payer: Medicare (Managed Care) | Attending: Emergency Medicine | Admitting: Emergency Medicine

## 2019-06-24 ENCOUNTER — Encounter (HOSPITAL_COMMUNITY): Payer: Self-pay

## 2019-06-24 ENCOUNTER — Other Ambulatory Visit: Payer: Self-pay

## 2019-06-24 DIAGNOSIS — Z79899 Other long term (current) drug therapy: Secondary | ICD-10-CM | POA: Insufficient documentation

## 2019-06-24 DIAGNOSIS — I10 Essential (primary) hypertension: Secondary | ICD-10-CM | POA: Diagnosis not present

## 2019-06-24 DIAGNOSIS — M546 Pain in thoracic spine: Secondary | ICD-10-CM | POA: Insufficient documentation

## 2019-06-24 DIAGNOSIS — G8929 Other chronic pain: Secondary | ICD-10-CM

## 2019-06-24 LAB — COMPREHENSIVE METABOLIC PANEL
ALT: 18 U/L (ref 0–44)
AST: 19 U/L (ref 15–41)
Albumin: 3.9 g/dL (ref 3.5–5.0)
Alkaline Phosphatase: 116 U/L (ref 38–126)
Anion gap: 9 (ref 5–15)
BUN: 13 mg/dL (ref 8–23)
CO2: 26 mmol/L (ref 22–32)
Calcium: 9 mg/dL (ref 8.9–10.3)
Chloride: 102 mmol/L (ref 98–111)
Creatinine, Ser: 0.82 mg/dL (ref 0.44–1.00)
GFR calc Af Amer: >60 mL/min (ref >60)
GFR calc non Af Amer: >60 mL/min (ref >60)
Glucose, Bld: 95 mg/dL (ref 70–99)
Potassium: 4.1 mmol/L (ref 3.5–5.1)
Sodium: 137 mmol/L (ref 135–145)
Total Bilirubin: 0.7 mg/dL (ref 0.3–1.2)
Total Protein: 6.8 g/dL (ref 6.5–8.1)

## 2019-06-24 LAB — URINALYSIS, ROUTINE W REFLEX MICROSCOPIC
Bilirubin Urine: NEGATIVE
Glucose, UA: NEGATIVE mg/dL
Hgb urine dipstick: NEGATIVE
Ketones, ur: NEGATIVE mg/dL
Leukocytes,Ua: NEGATIVE
Nitrite: NEGATIVE
Protein, ur: NEGATIVE mg/dL
Specific Gravity, Urine: 1.008 (ref 1.005–1.030)
pH: 8 (ref 5.0–8.0)

## 2019-06-24 LAB — CBC
HCT: 40.9 % (ref 36.0–46.0)
Hemoglobin: 13.4 g/dL (ref 12.0–15.0)
MCH: 32.1 pg (ref 26.0–34.0)
MCHC: 32.8 g/dL (ref 30.0–36.0)
MCV: 97.8 fL (ref 80.0–100.0)
Platelets: 232 10*3/uL (ref 150–400)
RBC: 4.18 MIL/uL (ref 3.87–5.11)
RDW: 12.3 % (ref 11.5–15.5)
WBC: 6.9 10*3/uL (ref 4.0–10.5)
nRBC: 0 % (ref 0.0–0.2)

## 2019-06-24 LAB — LIPASE, BLOOD: Lipase: 20 U/L (ref 11–51)

## 2019-06-24 MED ORDER — SODIUM CHLORIDE 0.9% FLUSH: 3.0000 mL | Freq: Once | INTRAVENOUS | Status: DC

## 2019-06-24 NOTE — ED Triage Notes (Signed)
Pt c/o right sided back pain and LUQ pain x 1 month. Pt states she has had multiple episodes of emesis.

## 2019-06-24 NOTE — ED Provider Notes (Signed)
[facility]-EMERGENCY DEPT Provider Note   CSN: [id number] Arrival date & time: 06/24/19  1316     History   Chief Complaint Chief Complaint  Patient presents with  . Abdominal Pain  . Back Pain    HPI Veronica Myers is a 78 y.o. female.     HPI Patient presents with concern of ongoing right lateral thoracic back pain. Pain is atraumatic, has been present for about a year, but now worse over the past 3 months. There is no abdominal pain, no dysuria, no hematuria, no chest pain, no dyspnea. Symptoms are worsening in general, but worse after recent exertion. Pain is focal, nonradiating, sore, severe, not improved with Tylenol, ibuprofen. Patient has been seeing a chiropractor, has had x-rays that were unremarkable as well. After speaking with her physician, she was referred here for MRI.  Past Medical History:  Diagnosis Date  . Arthritis   . Depression   . Diverticulitis   . GERD (gastroesophageal reflux disease)   . Glaucoma   . Hypertension   . Osteoporosis 12/12/2017   From old medical records. No dexa report attached. Had been on fosamax.   . Vitiligo 03/31/2018    Patient Active Problem List   Diagnosis Date Noted  . 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 Vitiligo 03/31/2018  . Major depression, recurrent, chronic (HCC) 12/12/2017  . Essential hypertension 12/12/2017  . GERD (gastroesophageal reflux disease) 12/12/2017  . History of diverticulitis 12/12/2017  . Osteoporosis 12/12/2017    History reviewed. No pertinent surgical history.   OB History   No obstetric history on file.      Home Medications    Prior to Admission medications   Medication Sig Start Date End Date Taking? Authorizing Provider  CALCIUM CARB-MAGNESIUM CARB PO Take by mouth.    [provider]  DULoxetine (CYMBALTA) 30 MG capsule  03/31/19   [provider]  Ginkgo Biloba 120 MG CAPS Take by mouth.    [provider]   GLUCOSAMINE-CHONDROIT-MSM-C-MN PO Take by mouth.    [provider]  ibandronate (BONIVA) 150 MG tablet Take 1 tablet (150 mg total) by mouth every 30 (thirty) days. With water, empty stomach, sit up for 30 minutes 03/13/18   Willow Ora, MD  metoprolol succinate (TOPROL-XL) 50 MG 24 hr tablet Take 1 tablet (50 mg total) by mouth daily. 06/29/18   Willow Ora, MD  Multiple Vitamins-Minerals (ZINC PO) Take by mouth.    [provider]  Omega-3 Krill Oil 500 MG CAPS Take by mouth.    [provider]  omeprazole (PRILOSEC) 20 MG capsule Take 1 capsule (20 mg total) by mouth daily. 06/01/19   Willow Ora, MD  Probiotic Product (ALIGN PO) Take by mouth.    [provider]    Family History Family History  Problem Relation Age of Onset  . Arthritis Mother   . Depression Mother   . Diabetes Mother   . Heart disease Mother   . Hypertension Mother   . Arthritis Father   . Depression Father   . Diabetes Father   . Heart disease Father   . Hypertension Father     Social History Social History   Tobacco Use  . Smoking status: Never Smoker  . Smokeless tobacco: Never Used  Substance Use Topics  . Alcohol use: Not Currently  . Drug use: Never     Allergies   Patient has no known allergies.   Review of  Systems Review of Systems  Constitutional:       Per HPI, otherwise negative  HENT:       Per HPI, otherwise negative  Respiratory:       Per HPI, otherwise negative  Cardiovascular:       Per HPI, otherwise negative  Gastrointestinal: Negative for vomiting.  Endocrine:       Negative aside from HPI  Genitourinary:       Neg aside from HPI   Musculoskeletal:       Per HPI, otherwise negative  Skin: Negative.   Neurological: Negative for syncope.     Physical Exam Updated Vital Signs BP (!) 174/95 (BP Location: Left Arm)   Pulse 85   Temp 98.5 F (36.9 C) (Oral)   Resp 16   Wt 66 kg   SpO2 99%   BMI 27.05 kg/m    Physical Exam Vitals signs and nursing note reviewed.  Constitutional:      General: She is not in acute distress.    Appearance: She is well-developed.  HENT:     Head: Normocephalic and atraumatic.  Eyes:     Conjunctiva/sclera: Conjunctivae normal.  Cardiovascular:     Rate and Rhythm: Normal rate and regular rhythm.  Pulmonary:     Effort: Pulmonary effort is normal. No respiratory distress.     Breath sounds: Normal breath sounds. No stridor.  Abdominal:     General: There is no distension.  Musculoskeletal:       Arms:  Skin:    General: Skin is warm and dry.  Neurological:     Mental Status: She is alert and oriented to person, place, and time.     Cranial Nerves: No cranial nerve deficit.      ED Treatments / Results  Labs (all labs ordered are listed, but only abnormal results are displayed) Labs Reviewed  URINALYSIS, ROUTINE W REFLEX MICROSCOPIC - Abnormal; Notable for the following components:      Result Value   APPearance HAZY (*)    All other components within normal limits  LIPASE, BLOOD  COMPREHENSIVE METABOLIC PANEL  CBC     Procedures Procedures (including critical care time)  Medications Ordered in ED Medications  sodium chloride flush (NS) 0.9 % injection 3 mL (has no administration in time range)     Initial Impression / Assessment and Plan / ED Course  I have reviewed the triage vital signs and the nursing notes.  Pertinent labs & imaging results that were available during my care of the patient were reviewed by me and considered in my medical decision making (see chart for details).    Labs unremarkable, vital signs unremarkable. This elderly female presents with concern of right thoracic back pain.  Patient's pain is been present for some time, there are some suspicion for osteoporosis versus scoliosis contributing to possible compression fracture versus other neuropathic etiology. Patient has no abdominal pain, no fever, no evidence  for bacteremia, sepsis with reassuring labs. MRI ordered to be performed as an outpatient with follow-up with her primary care physician.  Absent other complaints, neurovascular compromise, hemodynamic instability, patient is appropriate for further evaluation, monitoring, management as an outpatient.     Final Clinical Impressions(s) / ED Diagnoses   Final diagnoses:  Chronic right-sided thoracic back pain    ED Discharge Orders         Ordered    MR THORACIC SPINE WO CONTRAST    Comments: Patient with worsening thoracic right lower  pain, unremarkable x-rays.  Results to Dr. Billey Chang   06/24/19 1419           Carmin Muskrat, MD 06/24/19 [phone]

## 2019-06-24 NOTE — Discharge Instructions (Signed)
As discussed, today's evaluation has been generally reassuring. Your labs are unremarkable, your vital signs are reassuring as well. With your ongoing pain is important you follow-up with your primary care physician. An order has been placed for MRI, to be performed at [facility] as an outpatient.  If the scheduling department does not contact you tomorrow, please call to schedule this evaluation tomorrow, or earlier this week.   Please begin using topical medication patches including the ingredients lidocaine and methyl salicylate for relief.  Jenel Lucks is one manufacturer of this product.  Return here for concerning changes in your condition

## 2019-06-25 ENCOUNTER — Telehealth: Payer: Self-pay

## 2019-06-25 NOTE — Telephone Encounter (Signed)
Copied from [place] [id number]. Topic: General - Inquiry >> Jun 25, 2019 10:01 AM Mathis Bud wrote: Reason for CRM: patient is requesting a call back from nurse regarding her back.  Patient went to ER yesterday for her pain.  Patient wants to explain to nurse everything that went on. Call back [phone]

## 2019-06-26 ENCOUNTER — Other Ambulatory Visit: Payer: Self-pay | Admitting: *Deleted

## 2019-06-26 NOTE — Telephone Encounter (Signed)
Called pt, she reports that she went to ER due to back pain. She is feeling some better, does not want to have an earlier appointment than 11/11. Will callback if she decides to change

## 2019-07-11 ENCOUNTER — Ambulatory Visit: Payer: Self-pay | Admitting: Family Medicine

## 2019-07-16 ENCOUNTER — Other Ambulatory Visit: Payer: Self-pay

## 2019-07-16 ENCOUNTER — Encounter: Payer: Self-pay | Admitting: Family Medicine

## 2019-07-16 ENCOUNTER — Ambulatory Visit (INDEPENDENT_AMBULATORY_CARE_PROVIDER_SITE_OTHER): Payer: Medicare PPO | Admitting: Family Medicine

## 2019-07-16 VITALS — BP 124/82 | HR 83 | Temp 97.9°F | Ht 61.5 in | Wt 141.0 lb

## 2019-07-16 DIAGNOSIS — Z23 Encounter for immunization: Secondary | ICD-10-CM | POA: Diagnosis not present

## 2019-07-16 DIAGNOSIS — G8929 Other chronic pain: Secondary | ICD-10-CM

## 2019-07-16 DIAGNOSIS — M4134 Thoracogenic scoliosis, thoracic region: Secondary | ICD-10-CM | POA: Diagnosis not present

## 2019-07-16 DIAGNOSIS — M546 Pain in thoracic spine: Secondary | ICD-10-CM

## 2019-07-16 DIAGNOSIS — M810 Age-related osteoporosis without current pathological fracture: Secondary | ICD-10-CM

## 2019-07-16 DIAGNOSIS — Z1231 Encounter for screening mammogram for malignant neoplasm of breast: Secondary | ICD-10-CM | POA: Diagnosis not present

## 2019-07-16 NOTE — Progress Notes (Signed)
Subjective  CC:  Chief Complaint  Patient presents with  . Back Pain    HPI: Veronica Myers is a 78 y.o. female who presents to the office today to address the problems listed above in the chief complaint.  Reviewed recent ER notes: thoracic bp x 1 year with worsening x 3 months. An MRI was ordered. Says conferred with me but I have not discussed this the patient. Here for follow up: Patient had been living [place] recently relocated here think she will stay this time.  She reports several year history of back pain.  Symptoms started several years back after a fall down some concrete steps while in [place].  She reports she received physical therapy and treatments there without improvement until she went to what sounds like a alternative integrative doctor or kinesiologist who manipulated her muscles on her thighs and she felt much better.  She did well until about a year ago when she started having bilateral lateral thoracic back pain.  She points to lower ribs when she describes her pain.  The pain comes and goes and is aching.  In the last week she started taking turmeric and she thinks this is been helpful.  She denies mid back pain.  She does have scoliosis.  She has been seeing a chiropractor without relief as well.  She has osteoporosis and is on medication for this.  Her pain does not sound like a compression fracture.  I do not see recent thoracic x-rays although she says she had been at the chiropractor.  Pain does not interfere with sleep.  No lower back pain.  Health maintenance: Overdue for wellness visit.  Needs mammogram.  Flu shot and Pneumovax today. Assessment  1. Chronic bilateral thoracic back pain   2. Encounter for screening mammogram for breast cancer   3. Age-related osteoporosis without current pathological fracture   4. Thoracogenic scoliosis of thoracic region      Plan   Chronic bilateral back pain: Most consistent with musculoskeletal etiologies.  Tried to  educate.  Continue turmeric.  She does not want other oral medications at this time.  Recommend repeating x-rays and starting physical therapy.  Continue Boniva for osteoporosis.  No need for MRI at this time.  Schedule mammogram recommend complete physical and annual wellness visit to be scheduled.  Updated Pneumovax and flu shot today.  Follow up: Return for complete physical, AWV at patient's convenience.  Visit date not found  Orders Placed This Encounter  Procedures  . mammo BC  . DG Ribs Unilateral Right  . DG Thoracic Spine W/Swimmers  . PT-HPC   No orders of the defined types were placed in this encounter.     I reviewed the patients updated PMH, FH, and SocHx.    Patient Active Problem List   Diagnosis Date Noted  . Thoracogenic scoliosis of thoracic region 07/16/2019  . 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 Vitiligo 03/31/2018  . Major depression, recurrent, chronic ([place]) 12/12/2017  . Essential hypertension 12/12/2017  . GERD (gastroesophageal reflux disease) 12/12/2017  . History of diverticulitis 12/12/2017  . Osteoporosis 12/12/2017   Current Meds  Medication Sig  . CALCIUM CARB-MAGNESIUM CARB PO Take by mouth.  . DULoxetine (CYMBALTA) 30 MG capsule   . Ginkgo Biloba 120 MG CAPS Take by mouth.  Marland Kitchen GLUCOSAMINE-CHONDROIT-MSM-C-MN PO Take by mouth.  . ibandronate (BONIVA) 150 MG tablet Take 1 tablet (150 mg total) by mouth every 30 (thirty) days. With water, empty stomach, sit  up for 30 minutes  . metoprolol succinate (TOPROL-XL) 50 MG 24 hr tablet Take 1 tablet (50 mg total) by mouth daily.  . Multiple Vitamins-Minerals (ZINC PO) Take by mouth.  . Omega-3 Krill Oil 500 MG CAPS Take by mouth.  Marland Kitchen omeprazole (PRILOSEC) 20 MG capsule Take 1 capsule (20 mg total) by mouth daily.  . Probiotic Product (ALIGN PO) Take by mouth.    Allergies: Patient has No Known Allergies. Family History: Patient family history includes Arthritis in her father  and mother; Depression in her father and mother; Diabetes in her father and mother; Heart disease in her father and mother; Hypertension in her father and mother. Social History:  Patient  reports that she has never smoked. She has never used smokeless tobacco. She reports previous alcohol use. She reports that she does not use drugs.  Review of Systems: Constitutional: Negative for fever malaise or anorexia Cardiovascular: negative for chest pain Respiratory: negative for SOB or persistent cough Gastrointestinal: negative for abdominal pain  Objective  Vitals: BP 124/82 (BP Location: Left Arm, Patient Position: Sitting, Cuff Size: Normal)   Pulse 83   Temp 97.9 F (36.6 C) (Temporal)   Ht 5' 1.5" (1.562 m)   Wt 141 lb (64 kg)   SpO2 99%   BMI 26.21 kg/m  General: no acute distress , A&Ox3 appears comfortable, moves well HEENT: PEERL, conjunctiva normal, Oropharynx moist,neck is supple Cardiovascular:  RRR without murmur or gallop.  Respiratory:  Good breath sounds bilaterally, CTAB with normal respiratory effort Back: Nontender spine, nontender paravertebral musculature.  Reports pain over lateral lower ribs without focal or localized tenderness.  Full range of motion. Skin:  Warm, no rashes     Commons side effects, risks, benefits, and alternatives for medications and treatment plan prescribed today were discussed, and the patient expressed understanding of the given instructions. Patient is instructed to call or message via MyChart if he/she has any questions or concerns regarding our treatment plan. No barriers to understanding were identified. We discussed Red Flag symptoms and signs in detail. Patient expressed understanding regarding what to do in case of urgent or emergency type symptoms.   Medication list was reconciled, printed and provided to the patient in AVS. Patient instructions and summary information was reviewed with the patient as documented in the AVS. This note  was prepared with assistance of Dragon voice recognition software. Occasional wrong-word or sound-a-like substitutions may have occurred due to the inherent limitations of voice recognition software

## 2019-07-16 NOTE — Addendum Note (Signed)
Addended by: Elmer Bales on: 07/16/2019 11:46 AM   Modules accepted: Orders

## 2019-07-16 NOTE — Patient Instructions (Addendum)
Please return for your annual complete physical; please come fasting. This is overdue. Medicare recommends an Annual Wellness Visit for all patients. Please schedule this to be done with our Nurse Educator, Loma Sousa. This is an informative "talk" visit; it's goals are to ensure that your health care needs are being met and to give you education regarding avoiding falls, ensuring you are not suffering from depression or problems with memory or thinking, and to educate you on Advance Care Planning. It helps me take good care of you!  I"ve ordered physical therapy for you to help your back pain. We want to strengthen your back.   Today you were given your flu and pneumovax vaccination.   If you have any questions or concerns, please don't hesitate to send me a message via MyChart or call the office at [phone]. Thank you for visiting with [place] today! It's our pleasure caring for you.

## 2019-07-17 ENCOUNTER — Other Ambulatory Visit: Payer: Self-pay

## 2019-07-17 MED ORDER — METOPROLOL SUCCINATE ER 50 MG PO TB24: 50.0000 mg | ORAL_TABLET | Freq: Every day | ORAL | 3 refills | Status: DC

## 2019-07-18 ENCOUNTER — Ambulatory Visit (INDEPENDENT_AMBULATORY_CARE_PROVIDER_SITE_OTHER): Payer: Medicare PPO

## 2019-07-18 ENCOUNTER — Ambulatory Visit (INDEPENDENT_AMBULATORY_CARE_PROVIDER_SITE_OTHER): Payer: Medicare PPO | Admitting: Physical Therapy

## 2019-07-18 ENCOUNTER — Other Ambulatory Visit: Payer: Self-pay

## 2019-07-18 ENCOUNTER — Encounter: Payer: Self-pay | Admitting: Physical Therapy

## 2019-07-18 DIAGNOSIS — M546 Pain in thoracic spine: Secondary | ICD-10-CM

## 2019-07-18 DIAGNOSIS — M4134 Thoracogenic scoliosis, thoracic region: Secondary | ICD-10-CM | POA: Diagnosis not present

## 2019-07-18 DIAGNOSIS — G8929 Other chronic pain: Secondary | ICD-10-CM | POA: Diagnosis not present

## 2019-07-19 NOTE — Progress Notes (Signed)
Please call patient: Please let her know that her rib xrays are normal, but her spine films show compression fractures in 2 spots. The lower one may be the cause of her recent pain.  Use pain medication as needed. No further evaluation needed at this time; if worsens, would pursue an t-spine MRI.  Thanks.

## 2019-07-20 ENCOUNTER — Encounter: Payer: Self-pay | Admitting: Physical Therapy

## 2019-07-20 ENCOUNTER — Telehealth: Payer: Self-pay | Admitting: Family Medicine

## 2019-07-20 NOTE — Therapy (Signed)
[facility] [address] [address] [place], [place], [id number] Phone: [phone]   Fax:  [phone]  Physical Therapy Evaluation  Patient Details  Name: Veronica MusterMaria Myers MRN: [id number] Date of Birth: 03/11/1941 Referring Provider (PT): Asencion Partridgecamille Andy   Encounter Date: 07/18/2019  PT End of Session - 07/19/19 1410    Visit Number  1    Number of Visits  12    Date for PT Re-Evaluation  08/29/19    Authorization Type  [employer]    PT Start Time  1345    PT Stop Time  1428    PT Time Calculation (min)  43 min    Activity Tolerance  Patient tolerated treatment well    Behavior During Therapy  [facility] ParWFL for tasks assessed/performed       Past Medical History:  Diagnosis Date  . Arthritis   . Depression   . Diverticulitis   . GERD (gastroesophageal reflux disease)   . Glaucoma   . Hypertension   . Osteoporosis 12/12/2017   From old medical records. No dexa report attached. Had been on fosamax.   . Vitiligo 03/31/2018    History reviewed. No pertinent surgical history.  There were no vitals filed for this visit.   Subjective Assessment - 07/20/19 1253    Subjective  Pt states back pain in previous years that has resolved. Recently, about a month ago, she reports increased walking one day, did not have pain at the time, but had signfiicant pain the next day. Pain has improved some, but not resolved. She saw PCP, has x-rays ordered, wil have today. States pain in thoracic region and into bil lower ribs.    Limitations  Sitting;Lifting;Standing;Walking;House hold activities    Patient Stated Goals  decreasd pain, increased activity    Currently in Pain?  Yes    Pain Score  6     Pain Location  Back    Pain Orientation  Right;Left;Mid    Pain Descriptors / Indicators  Aching;Burning    Pain Type  Acute pain    Pain Onset  More than a month ago    Pain Frequency  Intermittent    Aggravating Factors   activity    Pain Relieving Factors   rest         [facility] - 07/19/19 1522      Assessment   Medical Diagnosis  Back Pain    Referring Provider (PT)  camille Mardelle MatteAndy    Prior Therapy  no      Precautions   Precautions  --   osteoporosis      Prior Function   Level of Independence  Independent      Cognition   Overall Cognitive Status  Within Functional Limits for tasks assessed      Posture/Postural Control   Posture Comments  Thoracic kyphosis      ROM / Strength   AROM / PROM / Strength  AROM;Strength      AROM   Overall AROM Comments  lumbar: WFL, Thoracic: significant limitation/stiffness;  Hips: mild limitation for rotation;       Strength   Overall Strength Comments  UE: 4/5 gross,  LE: 4/5 gross      Palpation   Palpation comment  Pain in central mid/low thoracic spine, and most pain into Bil lateral ribs 10-12       Special Tests   Other special tests  Neg SLR , Neg ULTT  Objective measurements completed on examination: See above findings.              PT Education - 07/19/19 1410    Education Details  PT POC, Exam findings,    Person(s) Educated  Patient    Methods  Explanation    Comprehension  Verbalized understanding          PT Long Term Goals - 07/19/19 1413      PT LONG TERM GOAL #1   Title  Pt to be independent with final HEP for spine health, posture, and  strengthening.    Time  6    Period  Weeks    Status  New    Target Date  08/29/19      PT LONG TERM GOAL #2   Title  Pt to demo ability for optimal seated posture, to decrease pain in back.    Time  6    Period  Weeks    Status  New    Target Date  08/29/19      PT LONG TERM GOAL #3   Title  Pt to demo ability for squat, bend/lift with correct mechanics for improved ability and safety with IADLs.    Time  6    Period  Weeks    Status  New    Target Date  08/29/19      PT LONG TERM GOAL #4   Title  Pt to demo improved core and hip strength to at least 4+/5 to  improve stability    Time  6    Period  Weeks    Status  New    Target Date  08/29/19             Plan - 07/19/19 1416    Clinical Impression Statement  Pt presents with primary complaint of increased pain in thoracic and lumbar spine. Pt with most pain at mild/lower thoracic region, on spine, and into ribs with palpation. She has mild tightness of lumbar musculature. Poor posture, with thoracic kyphosis. She has mild weakness in hips and core. Pt with increased pain and deficits that are limiting functional activities, pt to benefit from skilled PT to improve. She has x-rays ordered, recommended that she obtain, to rule out fx due to osteoporosis.    Personal Factors and Comorbidities  Comorbidity 1    Comorbidities  Osteoporosis, scoliosis,    Examination-Activity Limitations  Bathing;Locomotion Level;Transfers;Bed Mobility;Bend;Sit;Sleep;Squat;Stand;Lift    Examination-Participation Restrictions  Meal Prep;Cleaning;Community Activity;Driving;Shop;Laundry;Yard Work    Conservation officer, historic buildings  Evolving/Moderate complexity    Clinical Decision Making  Moderate    Rehab Potential  Good    PT Frequency  2x / week    PT Duration  6 weeks    PT Treatment/Interventions  ADLs/Self Care Home Management;Cryotherapy;Electrical Stimulation;DME Instruction;Ultrasound;Moist Heat;Iontophoresis 4mg /ml Dexamethasone;Gait training;Stair training;Functional mobility training;Therapeutic activities;Therapeutic exercise;Balance training;Neuromuscular re-education;Manual techniques;Patient/family education;Passive range of motion;Dry needling;Taping;Spinal Manipulations;Joint Manipulations    Consulted and Agree with Plan of Care  Patient       Patient will benefit from skilled therapeutic intervention in order to improve the following deficits and impairments:     Visit Diagnosis: Chronic bilateral thoracic back pain - Plan: DG Ribs Unilateral Right, DG Thoracic Spine W/Swimmers, CANCELED:  DG Thoracic Spine W/Swimmers, CANCELED: DG Ribs Unilateral Right     Problem List Patient Active Problem List   Diagnosis Date Noted  . Thoracogenic scoliosis of thoracic region 07/16/2019  . 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 Vitiligo  03/31/2018  . Major depression, recurrent, chronic ([place]) 12/12/2017  . Essential hypertension 12/12/2017  . GERD (gastroesophageal reflux disease) 12/12/2017  . History of diverticulitis 12/12/2017  . Osteoporosis 12/12/2017   Lyndee Hensen, PT, DPT 12:54 PM  07/20/19    [facility] [place], [place], [address] Phone: [phone]   Fax:  [phone]  Name: Willowdean Luhmann MRN: [id number] Date of Birth: 08-26-1941

## 2019-07-20 NOTE — Telephone Encounter (Signed)
Spoke with patient's daughter.  She states the patient is "very sad" and has also been angry.  No threats to harm self or others.  She is asking for referral to psychiatry.  Daughter thinks patient would go and talk to psychiatrist.  Also gave information for behavioral health walk-in clinic.  Please advise.

## 2019-07-20 NOTE — Telephone Encounter (Signed)
She may make an appointment with a psychiatrist. They do not take referrals.  Here are a few options:  Psychiatrists  [place] [address] [place], [place] [address] [phone]  McDonald, NP [address], [place], [place] [address] [place], [place] [address] [phone]  [facility] [place], NP Magda Paganini NP [address] [place], [place] [address] [phone]  Counseling centers only:  [facility] [address] [place], [place] Out[facility]: [facility] [address] [place] [address] [place], [place] [phone]   [facility] [address] [place], [place] [address] [place], [place] [address] [place]  [facility] [address] [place], [place] [address] [phone]

## 2019-07-20 NOTE — Telephone Encounter (Signed)
See note, please advise after triaging if appointment is needed or make appointment.

## 2019-07-20 NOTE — Telephone Encounter (Signed)
Pt daughter Margreta Journey is calling and her mother got off her anxiety medication about week and half ago and she is experiencing angry and ripping clothes up  and depression. Pt daughter would like md to send her mother to psychiatrist or  either put her on mild anxiety med. Pt has never seen her mother act this way. [facility] [address]

## 2019-07-23 ENCOUNTER — Other Ambulatory Visit: Payer: Self-pay

## 2019-07-24 ENCOUNTER — Ambulatory Visit (INDEPENDENT_AMBULATORY_CARE_PROVIDER_SITE_OTHER): Payer: Medicare PPO | Admitting: Physical Therapy

## 2019-07-24 ENCOUNTER — Encounter: Payer: Self-pay | Admitting: Physical Therapy

## 2019-07-24 DIAGNOSIS — G8929 Other chronic pain: Secondary | ICD-10-CM

## 2019-07-24 DIAGNOSIS — M546 Pain in thoracic spine: Secondary | ICD-10-CM

## 2019-07-29 ENCOUNTER — Encounter: Payer: Self-pay | Admitting: Physical Therapy

## 2019-07-29 NOTE — Therapy (Signed)
[facility] [address] [place], [place], [address] Phone: [phone]   Fax:  [phone]  Physical Therapy Treatment  Patient Details  Name: Veronica Myers MRN: [id number] Date of Birth: [birth year] Referring Provider (PT): Billey Chang   Encounter Date: 07/24/2019  PT End of Session - 07/29/19 2113    Visit Number  2    Number of Visits  12    Date for PT Re-Evaluation  08/29/19    Authorization Type  [employer]    PT Start Time  1230    PT Stop Time  1300    PT Time Calculation (min)  30 min    Activity Tolerance  Patient tolerated treatment well    Behavior During Therapy  [facility] for tasks assessed/performed       Past Medical History:  Diagnosis Date  . Arthritis   . Depression   . Diverticulitis   . GERD (gastroesophageal reflux disease)   . Glaucoma   . Hypertension   . Osteoporosis 12/12/2017   From old medical records. No dexa report attached. Had been on fosamax.   . Vitiligo 03/31/2018    History reviewed. No pertinent surgical history.  There were no vitals filed for this visit.  Subjective Assessment - 07/29/19 2111    Subjective  Pt states soreness in back. No new complaints. Pt 15 min late ,thought her appt was at 12:30    Patient Stated Goals  decreasd pain, increased activity    Currently in Pain?  Yes    Pain Score  5     Pain Location  Back    Pain Orientation  Right;Left;Mid    Pain Descriptors / Indicators  Burning;Aching    Pain Type  Acute pain    Pain Onset  More than a month ago    Pain Frequency  Intermittent                       [facility] Adult PT Treatment/Exercise - 07/29/19 0001      Transfers   Comments  Supine to sit x3;       Posture/Postural Control   Posture Comments  Thoracic kyphosis      Self-Care   Self-Care  Posture    Posture  Optimal posture for osteoporosis, sleeping, sitting, and bending.       Exercises   Exercises  Lumbar      Lumbar  Exercises: Standing   Functional Squats  10 reps    Functional Squats Limitations  with education on correct bending, squat mechanics for back pain     Row  20 reps    Row Limitations  with TA      Lumbar Exercises: Supine   Ab Set  10 reps    Clam  20 reps    Other Supine Lumbar Exercises  Hip Add ball squeeze, 2x10                   PT Long Term Goals - 07/19/19 1413      PT LONG TERM GOAL #1   Title  Pt to be independent with final HEP for spine health, posture, and  strengthening.    Time  6    Period  Weeks    Status  New    Target Date  08/29/19      PT LONG TERM GOAL #2   Title  Pt to demo ability for optimal seated posture, to decrease pain  in back.    Time  6    Period  Weeks    Status  New    Target Date  08/29/19      PT LONG TERM GOAL #3   Title  Pt to demo ability for squat, bend/lift with correct mechanics for improved ability and safety with IADLs.    Time  6    Period  Weeks    Status  New    Target Date  08/29/19      PT LONG TERM GOAL #4   Title  Pt to demo improved core and hip strength to at least 4+/5 to improve stability    Time  6    Period  Weeks    Status  New    Target Date  08/29/19            Plan - 07/29/19 2115    Clinical Impression Statement  Pt had recent x-ray, + for compression fractures. Recommended pt follow up with Sports Med, to discuss results, and treatment. Pt will make appt today. Posture and positions for decompression of spine discussed today, for sitting, standing, transfers and ther ex. Ther ex progressed today. Pt with much difficulty achieving TA contraction, will benefit from continued education on this.    Personal Factors and Comorbidities  Comorbidity 1    Comorbidities  Osteoporosis, scoliosis,    Examination-Activity Limitations  Bathing;Locomotion Level;Transfers;Bed Mobility;Bend;Sit;Sleep;Squat;Stand;Lift    Examination-Participation Restrictions  Meal Prep;Cleaning;Community  Activity;Driving;Shop;Laundry;Yard Work    Conservation officer, historic buildings  Evolving/Moderate complexity    Rehab Potential  Good    PT Frequency  2x / week    PT Duration  6 weeks    PT Treatment/Interventions  ADLs/Self Care Home Management;Cryotherapy;Electrical Stimulation;DME Instruction;Ultrasound;Moist Heat;Iontophoresis 4mg /ml Dexamethasone;Gait training;Stair training;Functional mobility training;Therapeutic activities;Therapeutic exercise;Balance training;Neuromuscular re-education;Manual techniques;Patient/family education;Passive range of motion;Dry needling;Taping;Spinal Manipulations;Joint Manipulations    Consulted and Agree with Plan of Care  Patient       Patient will benefit from skilled therapeutic intervention in order to improve the following deficits and impairments:  Decreased range of motion, Increased muscle spasms, Decreased endurance, Pain, Decreased activity tolerance, Hypomobility, Impaired flexibility, Improper body mechanics, Postural dysfunction, Decreased strength, Decreased mobility  Visit Diagnosis: Chronic bilateral thoracic back pain     Problem List Patient Active Problem List   Diagnosis Date Noted  . Thoracogenic scoliosis of thoracic region 07/16/2019  . 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 Vitiligo 03/31/2018  . Major depression, recurrent, chronic (HCC) 12/12/2017  . Essential hypertension 12/12/2017  . GERD (gastroesophageal reflux disease) 12/12/2017  . History of diverticulitis 12/12/2017  . Osteoporosis 12/12/2017   12/14/2017, PT, DPT 9:21 PM  07/29/19    [facility] [address] [place], [place], [place] Phone: [phone]   Fax:  [phone]  Name: Veronica Myers MRN: [name] Date of Birth: 1940-09-22

## 2019-07-30 ENCOUNTER — Other Ambulatory Visit: Payer: Self-pay

## 2019-07-30 ENCOUNTER — Ambulatory Visit (INDEPENDENT_AMBULATORY_CARE_PROVIDER_SITE_OTHER): Payer: Medicare PPO | Admitting: Family Medicine

## 2019-07-30 ENCOUNTER — Encounter: Payer: Self-pay | Admitting: Family Medicine

## 2019-07-30 VITALS — BP 140/80 | HR 84 | Ht 61.5 in | Wt 143.6 lb

## 2019-07-30 DIAGNOSIS — S22000A Wedge compression fracture of unspecified thoracic vertebra, initial encounter for closed fracture: Secondary | ICD-10-CM | POA: Diagnosis not present

## 2019-07-30 DIAGNOSIS — M8000XA Age-related osteoporosis with current pathological fracture, unspecified site, initial encounter for fracture: Secondary | ICD-10-CM

## 2019-07-30 LAB — VITAMIN D 25 HYDROXY (VIT D DEFICIENCY, FRACTURES): VITD: 73.28 ng/mL (ref 30.00–100.00)

## 2019-07-30 NOTE — Patient Instructions (Signed)
Thank you for coming in today. Get labs today.  You should hear about MRI.  Recheck with me after MRI.  Let me know if you do not hear about MRI scheduling in 1 weeks.    Cifoplastia com balo Balloon Kyphoplasty Uma cifoplastia com balo  um procedimento para tratar uma fratura por compresso da coluna vertebral, que  o colapso dos ossos que formam a coluna vetebral (vrtebras). Nesse tipo de fratura, as vrtebras so pressionadas (comprimidas) e assumem um formato de cunha, o que causa dor. Nesse procedimento, as vrtebras colapsadas so expandidas com um balo, e cimento sseo  injetado nas vrtebras para fortalec-las. Informe o mdico sobre:  Scientist, research (medical).  Todos os medicamentos que [employer], incluindo vitaminas, fitoterpicos, colrios, cremes e medicamentos de venda sem receita mdica.  Quaisquer problemas que voc ou parentes seus tiverem experimentado com anestsicos.  Quaisquer doenas sanguneas.  Cirurgias anteriores.  Qualquer eventual doena ou condio [employer] voc tem ou tenha tido.  Charlann Noss ou possibilidade de Charlann Noss. Quais so os riscos? Esse procedimento geralmente  seguro. Mas problemas podem ocorrer, incluindo:  Infeco.  Hemorragia.  Aumento da dor nas costas.  Reaes alrgicas a medicamentos.  Danos a outras estruturas, nervos ou rgos.  Vazamento de cimento sseo em outras partes do organismo. O que acontece antes do procedimento? Medicamentos Pergunte ao seu mdico sobre:  Alterao ou interrupo dos seus medicamentos regulares. Isso ser especialmente importante se voc estiver tomando medicamentos para diabetes ou anticoagulantes.  Uso de medicamentos como aspirina e ibuprofeno. Esses medicamentos podem afinar seu sangue. No tome esses medicamentos a menos que seu mdico permita.  O uso de medicamentos, vitaminas, fitoterpicos e suplementos vendidos sem receita mdica. Instrues gerais  Siga as instrues do  seu mdico no que se refere a restries de [employer].  No use produtos que contenham nicotina ou tabaco por pelo menos 4 semanas antes do procedimento. Esses produtos incluem cigarro, tabaco de mascar e cigarro eletrnico. Caso precise de ajuda para parar de fumar, fale com seu mdico.  Pergunte ao seu mdico: ? Pension scheme manager cirurgia ser identificado. ? Quais medidas sero tomadas para prevenir infeces. Isso pode incluir:  Remover os pelos no local da cirurgia.  Lavar a pele com um sabo que mata os germes.  Tomar antibitico.  Planeje para que algum leve voc para casa do hospital ou clnica.  Caso v para casa logo aps o procedimento, arranje para que algum fique com voc por 24 horas. O que acontece durante o procedimento?   Um tubo intravenoso (IV) ser inserido em uma de suas veias.  Voc receber um ou [employer] seguintes itens: ? Um medicamento para ajudar voc a relaxar (sedativo). ? Um medicamento para entorpecer a rea (anestesia local). ? Um medicamento para fazer voc dormir (anestesia geral).  Seu cirurgio usar uma mquina de radiografia para visualizar sua fratura por compresso da espinha.  Everett Graff ser inserida [employer] da pele e dentro de sua coluna.  Rushie Chestnut, o balo ser colocado na sua coluna vertebral onde as fraturas esto localizadas.  O balo ser inflado. Isso criar espao e empurrar o osso at [employer] altura e formato normais.  O balo ser removido.  O espao recm-criado na sua coluna vertebral ser preenchido com cimento sseo.  Quando o Environmental health practitioner, a Air cabin crew ser [employer].  O local da puno com agulha ser fechado com cola cirrgica ou fita [place].  Uma atadura (curativo) pode ser  usada para cobrir o Scientist, research (physical sciences) puno com agulha. O procedimento pode variar entre mdicos e hospitais. O que acontece aps o procedimento?  Sua presso arterial, frequncia cardaca, frequncia  respiratria e nvel de oxignio no sangue sero monitorados at voc ir [employer] ou hospital.  Voc sentir alguma dor. Analgsicos estaro disponveis para ajud-lo.  Uma compressa de gelo pode ser colocada sobre o local da puno.  No dirija por 24 horas caso tenha recebido um [employer] o procedimento. Resumo  Uma cifoplastia com balo  um procedimento para tratar uma fratura por compresso da coluna vertebral, que  o colapso dos ossos que formam a coluna vetebral (vrtebras).  Antes do procedimento, siga as instrues do seu mdico no que se refere a restries de alimentos ou bebidas.  Pergunte a seu mdico sobre a necessidade de Art therapist ou interromper seus medicamentos regulares. Isso ser especialmente importante se voc estiver tomando medicamentos para diabetes ou anticoagulantes.  Planeje para que algum leve voc para casa do hospital ou clnica. No dirija por 24 horas caso tenha recebido um [employer] o procedimento.  Caso v para casa logo aps o procedimento, arranje para que algum fique com voc por 24 horas. Estas informaes no se destinam a substituir as recomendaes de seu mdico. No deixe de discutir quaisquer dvidas com seu mdico. Document Released: 08/04/2009 Document Revised: 08/09/2018 Document Reviewed: 08/09/2018 Elsevier Patient Education  2020 [employer].

## 2019-07-30 NOTE — Progress Notes (Signed)
Subjective:    CC: R-sided T-spine pain  I, Veronica Myers, LAT, ATC, am serving as scribe for Dr. Clementeen Graham.  HPI: Pt is a 78 y/o female presenting w/ chronic R-sided t-spine pain x approximately one year but has worsened over the past 2 months.  Pt was seen at the [facility] ED for these symptoms on 06/24/19.  She had T-spine and rib XRs on 07/18/19 and has completed 3 PT sessions w/ Sedalia Muta at [facility].  Pt rates her pain as moderate, sharp pain that is intermittent in nature.  She locates her pain to her R mid-T spine.  Pt has been taking curcumin and has been using a pain patch which are helping w/ her symptoms.  Pt's symptoms are worsened w/ active use of her arms and w/ pressure to the area I.e. cannot wear a normal bra due to pain from the strap.  She notes that 2 years ago she fell down some stairs in [place] fracturing her left elbow.  She did experience back pain during that episode as well.  She notes her thoracic back pain had improved mostly until it worsened again about 2 to 3 months ago.  She cannot recall any specific injury or incident occurring recently to explain her back pain.  She has been to a chiropractor which did not help much.  Additionally she has a significant medical history for significant osteoporosis with T score at -4.1 measured last year.  Past medical history, Surgical history, Family history not pertinant except as noted below, Social history, Allergies, and medications have been entered into the medical record, reviewed, and no changes needed.   Review of Systems: No headache, visual changes, nausea, vomiting, diarrhea, constipation, dizziness, abdominal pain, skin rash, fevers, chills, night sweats, weight loss, swollen lymph nodes, body aches, joint swelling, muscle aches, chest pain, shortness of breath, mood changes, visual or auditory hallucinations.   Objective:    Vitals:   07/30/19 1055  BP: 140/80  Pulse: 84  SpO2: 94%   General:  Well Developed, well nourished, and in no acute distress.  Neuro/Psych: Alert and oriented x3, extra-ocular muscles intact, able to move all 4 extremities, sensation grossly intact. Skin: Warm and dry, no rashes noted.  Respiratory: Not using accessory muscles, speaking in full sentences, trachea midline.  Cardiovascular: Pulses palpable, no extremity edema. Abdomen: Does not appear distended. MSK:  T-spine: Slight kyphosis otherwise normal-appearing Nontender to spinal midline.  Tender palpation right perimuscular lower T-spine. Decreased T-spine motion. Lower extremity strength is intact with normal gait.  Lab and Radiology Results Dg Ribs Unilateral Right  Result Date: 07/19/2019 CLINICAL DATA:  Right lateral back pain.  Thoracic scoliosis EXAM: RIGHT RIBS - 2 VIEW COMPARISON:  None. FINDINGS: No fracture or other bone lesions are seen involving the ribs. IMPRESSION: Negative. Electronically Signed   By: Marlan Palau M.D.   On: 07/19/2019 08:00   Dg Thoracic Spine W/swimmers  Result Date: 07/19/2019 CLINICAL DATA:  Back pain EXAM: THORACIC SPINE - 3 VIEWS COMPARISON:  None. FINDINGS: Severe compression fracture T6 of indeterminate age but possibly chronic Moderate compression fracture superior endplate of T10 of indeterminate age but possibly recent. No mass lesion.  Mild thoracic kyphosis.  Minimal scoliosis. IMPRESSION: Compression fractures T6 and T10 of indeterminate age. T10 fracture could be acute. No prior studies available for comparison. Consider MRI to further date these fractures. Electronically Signed   By: Marlan Palau M.D.   On: 07/19/2019 08:03  I, Lynne Leader, personally (independently) visualized and performed the interpretation of the images attached in this note.   Impression and Recommendations:    Assessment and Plan: 78 y.o. female with worsening thoracic back pain occurring over the last 2 to 3 months. X-ray obtained in November shows compression fracture  at T6 and T10.  T6 compression fracture is severe and likely old and T10 is indeterminate.  This fits with her history of having an injury 2 years ago which likely was the T6 compression fracture and worsening pain in the lower right thoracic area which would correspond to the indeterminate T10 compression fracture.  Given her 2 to 3 months duration of symptoms she is a good candidate for further evaluation as she may benefit from kyphoplasty or vertebroplasty..  Plan for MRI for kyphoplasty/vertebroplasty planning.  Additionally given her severe osteoporosis will check vitamin D and PTH ionized calcium.  PDMP not reviewed this encounter. Orders Placed This Encounter  Procedures  . MR Thoracic Spine Wo Contrast    Standing Status:   Future    Standing Expiration Date:   09/28/2020    Order Specific Question:   What is the patient's sedation requirement?    Answer:   No Sedation    Order Specific Question:   Does the patient have a pacemaker or implanted devices?    Answer:   Yes    Order Specific Question:   Manufacturer of pacemake or implanted device?    Answer:   Left elbow surery 3 yrs ago in [place]    Order Specific Question:   Preferred imaging location?    Answer:   [facility] (table limit-550lbs)    Order Specific Question:   Radiology Contrast Protocol - do NOT remove file path    Answer:   \\[email or web address]  . Vitamin D (25 hydroxy)  . PTH, Intact and Calcium   No orders of the defined types were placed in this encounter.   Discussed warning signs or symptoms. Please see discharge instructions. Patient expresses understanding.   The above documentation has been reviewed and is accurate and complete Lynne Leader

## 2019-07-31 ENCOUNTER — Telehealth: Payer: Self-pay | Admitting: Family Medicine

## 2019-07-31 ENCOUNTER — Ambulatory Visit (INDEPENDENT_AMBULATORY_CARE_PROVIDER_SITE_OTHER): Payer: Medicare PPO | Admitting: Physical Therapy

## 2019-07-31 DIAGNOSIS — G8929 Other chronic pain: Secondary | ICD-10-CM | POA: Diagnosis not present

## 2019-07-31 DIAGNOSIS — M546 Pain in thoracic spine: Secondary | ICD-10-CM

## 2019-07-31 LAB — PTH, INTACT AND CALCIUM
Calcium: 9.3 mg/dL (ref 8.6–10.4)
PTH: 32 pg/mL (ref 14–64)

## 2019-07-31 NOTE — Telephone Encounter (Signed)
See note  Copied from [place] [id number]. Topic: General - Other >> Jul 31, 2019  3:29 PM Erick Blinks wrote: Reason for CRM: Haynes Hoehn from [facility] called to report that pt needs authorization for [phone]. Please advise  Authorization dept: [phone] Best contact: [phone]

## 2019-07-31 NOTE — Progress Notes (Signed)
Vit D levels are great.

## 2019-07-31 NOTE — Patient Instructions (Signed)
Access Code: [email or web address]  URL: [email or web address]  Date: 07/31/2019  Prepared by: Lyndee Hensen   Exercises Supine Transversus Abdominis Bracing - Hands on Ground - 10 reps - 2 sets - 1x daily Supine Shoulder Flexion with Dowel - 10 reps - 1 sets - 1x daily Supine March - 10 reps - 2 sets - 1x daily Supine Shoulder Horizontal Abduction with Resistance - 10 reps - 2 sets Hooklying Clamshell with Resistance - 10 reps - 2 sets - 1x daily Scapular Retraction with Resistance - 10 reps - 2 sets - 1x daily

## 2019-07-31 NOTE — Progress Notes (Signed)
Parathyroid hormone and calcium are normal

## 2019-07-31 NOTE — Therapy (Signed)
[facility] [address] [place], [place], [address] Phone: [phone]   Fax:  [phone]  Physical Therapy Treatment  Patient Details  Name: Veronica Myers MRN: [id number] Date of Birth: 12/25/1940 Referring Provider (PT): Billey Chang   Encounter Date: 07/31/2019  PT End of Session - 07/31/19 1456    Visit Number  3    Number of Visits  12    Date for PT Re-Evaluation  08/29/19    Authorization Type  [employer] Medicare    PT Start Time  1216    PT Stop Time  1300    PT Time Calculation (min)  44 min    Activity Tolerance  Patient tolerated treatment well    Behavior During Therapy  [facility] for tasks assessed/performed       Past Medical History:  Diagnosis Date  . Arthritis   . Depression   . Diverticulitis   . GERD (gastroesophageal reflux disease)   . Glaucoma   . Hypertension   . Osteoporosis 12/12/2017   From old medical records. No dexa report attached. Had been on fosamax.   . Vitiligo 03/31/2018    No past surgical history on file.  There were no vitals filed for this visit.  Subjective Assessment - 07/31/19 1456    Subjective  Pt states back pain is improving.    Currently in Pain?  Yes    Pain Score  4     Pain Location  Back    Pain Orientation  Right;Left;Mid    Pain Descriptors / Indicators  Aching;Burning    Pain Type  Acute pain    Pain Onset  More than a month ago    Pain Frequency  Intermittent                       [facility] Adult PT Treatment/Exercise - 07/31/19 1220      Transfers   Comments  --      Posture/Postural Control   Posture Comments  Thoracic kyphosis      Self-Care   Self-Care  Posture    Posture  --      Exercises   Exercises  Lumbar      Lumbar Exercises: Aerobic   Stationary Bike  L1 x 7 min;       Lumbar Exercises: Standing   Functional Squats  10 reps    Functional Squats Limitations  with education on correct bending, squat mechanics for back pain     Row  20 reps    Theraband Level (Row)  Level 2 (Red)    Other Standing Lumbar Exercises  Hip Abd 2x10 bil;  march x20;       Lumbar Exercises: Seated   Sit to Stand  10 reps      Lumbar Exercises: Supine   Ab Set  20 reps    AB Set Limitations  max education on performance    Clam  20 reps    Clam Limitations  GTB x20;     Bent Knee Raise  20 reps    Other Supine Lumbar Exercises  --    Other Supine Lumbar Exercises  Shoulder flex x10 bil;  horizontal abd 2x10;                   PT Long Term Goals - 07/19/19 1413      PT LONG TERM GOAL #1   Title  Pt to be independent with final HEP  for spine health, posture, and  strengthening.    Time  6    Period  Weeks    Status  New    Target Date  08/29/19      PT LONG TERM GOAL #2   Title  Pt to demo ability for optimal seated posture, to decrease pain in back.    Time  6    Period  Weeks    Status  New    Target Date  08/29/19      PT LONG TERM GOAL #3   Title  Pt to demo ability for squat, bend/lift with correct mechanics for improved ability and safety with IADLs.    Time  6    Period  Weeks    Status  New    Target Date  08/29/19      PT LONG TERM GOAL #4   Title  Pt to demo improved core and hip strength to at least 4+/5 to improve stability    Time  6    Period  Weeks    Status  New    Target Date  08/29/19            Plan - 07/31/19 1457    Clinical Impression Statement  Pt with improving ability for ther ex. Does requires max cuing for TA contraction, improved ability after education and practice today. Continued focus on posture and decompression for spine.    Personal Factors and Comorbidities  Comorbidity 1    Comorbidities  Osteoporosis, scoliosis,    Examination-Activity Limitations  Bathing;Locomotion Level;Transfers;Bed Mobility;Bend;Sit;Sleep;Squat;Stand;Lift    Examination-Participation Restrictions  Meal Prep;Cleaning;Community Activity;Driving;Shop;Laundry;Yard Work     Conservation officer, historic buildings  Evolving/Moderate complexity    Rehab Potential  Good    PT Frequency  2x / week    PT Duration  6 weeks    PT Treatment/Interventions  ADLs/Self Care Home Management;Cryotherapy;Electrical Stimulation;DME Instruction;Ultrasound;Moist Heat;Iontophoresis 4mg /ml Dexamethasone;Gait training;Stair training;Functional mobility training;Therapeutic activities;Therapeutic exercise;Balance training;Neuromuscular re-education;Manual techniques;Patient/family education;Passive range of motion;Dry needling;Taping;Spinal Manipulations;Joint Manipulations    Consulted and Agree with Plan of Care  Patient       Patient will benefit from skilled therapeutic intervention in order to improve the following deficits and impairments:  Decreased range of motion, Increased muscle spasms, Decreased endurance, Pain, Decreased activity tolerance, Hypomobility, Impaired flexibility, Improper body mechanics, Postural dysfunction, Decreased strength, Decreased mobility  Visit Diagnosis: Chronic bilateral thoracic back pain     Problem List Patient Active Problem List   Diagnosis Date Noted  . Thoracogenic scoliosis of thoracic region 07/16/2019  . 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 Vitiligo 03/31/2018  . Major depression, recurrent, chronic (HCC) 12/12/2017  . Essential hypertension 12/12/2017  . GERD (gastroesophageal reflux disease) 12/12/2017  . History of diverticulitis 12/12/2017  . Osteoporosis 12/12/2017    12/14/2017, PT, DPT 2:59 PM  07/31/19    [facility] [address] [place], [place], [place] Phone: [phone]   Fax:  [phone]  Name: Veronica Myers MRN: [name] Date of Birth: 1941/05/11

## 2019-08-02 ENCOUNTER — Encounter: Payer: Self-pay | Admitting: Physical Therapy

## 2019-08-02 ENCOUNTER — Ambulatory Visit (INDEPENDENT_AMBULATORY_CARE_PROVIDER_SITE_OTHER): Payer: Medicare PPO | Admitting: Physical Therapy

## 2019-08-02 ENCOUNTER — Other Ambulatory Visit: Payer: Self-pay

## 2019-08-02 DIAGNOSIS — M546 Pain in thoracic spine: Secondary | ICD-10-CM | POA: Diagnosis not present

## 2019-08-02 DIAGNOSIS — G8929 Other chronic pain: Secondary | ICD-10-CM | POA: Diagnosis not present

## 2019-08-02 NOTE — Therapy (Signed)
[facility] [address] [place], [place], [address] Phone: [phone]   Fax:  [phone]  Physical Therapy Treatment  Patient Details  Name: Veronica Myers MRN: [id number] Date of Birth: Feb 12, 1941 Referring Provider (PT): Asencion Partridge   Encounter Date: 08/02/2019  PT End of Session - 08/02/19 1245    Visit Number  4    Number of Visits  12    Date for PT Re-Evaluation  08/29/19    Authorization Type  [employer]    PT Start Time  1218    PT Stop Time  1257    PT Time Calculation (min)  39 min    Activity Tolerance  Patient tolerated treatment well    Behavior During Therapy  [facility] for tasks assessed/performed       Past Medical History:  Diagnosis Date  . Arthritis   . Depression   . Diverticulitis   . GERD (gastroesophageal reflux disease)   . Glaucoma   . Hypertension   . Osteoporosis 12/12/2017   From old medical records. No dexa report attached. Had been on fosamax.   . Vitiligo 03/31/2018    History reviewed. No pertinent surgical history.  There were no vitals filed for this visit.  Subjective Assessment - 08/02/19 1244    Subjective  Pt with questions about wearing back brace. She wore to sleep in a couple days ago, did not feel that was comfortable.    Currently in Pain?  Yes    Pain Location  Back    Pain Orientation  Right;Left;Mid    Pain Descriptors / Indicators  Aching;Burning    Pain Type  Acute pain    Pain Onset  More than a month ago    Pain Frequency  Intermittent         [facility] PT Assessment - 08/02/19 1235      Posture/Postural Control   Posture Comments  Thoracic kyphosis                   [facility] Adult PT Treatment/Exercise - 08/02/19 1237      Posture/Postural Control   Posture Comments  Thoracic kyphosis      Self-Care   Self-Care  Posture      Exercises   Exercises  Lumbar      Lumbar Exercises: Aerobic   Stationary Bike  L1 x 7 min;       Lumbar Exercises:  Standing   Functional Squats  --    Functional Squats Limitations  --    Row  20 reps    Theraband Level (Row)  Level 2 (Red)    Other Standing Lumbar Exercises  --      Lumbar Exercises: Seated   Sit to Stand  10 reps      Lumbar Exercises: Supine   Ab Set  15 reps    AB Set Limitations  max education on performance    Clam  20 reps    Clam Limitations  GTB x20;     Bent Knee Raise  20 reps    Other Supine Lumbar Exercises  TA with:   Shoulder flex cane , 1.5 lb x15 bil;  horizontal abd 2x10 YTB ;                   PT Long Term Goals - 07/19/19 1413      PT LONG TERM GOAL #1   Title  Pt to be independent with final HEP for  spine health, posture, and  strengthening.    Time  6    Period  Weeks    Status  New    Target Date  08/29/19      PT LONG TERM GOAL #2   Title  Pt to demo ability for optimal seated posture, to decrease pain in back.    Time  6    Period  Weeks    Status  New    Target Date  08/29/19      PT LONG TERM GOAL #3   Title  Pt to demo ability for squat, bend/lift with correct mechanics for improved ability and safety with IADLs.    Time  6    Period  Weeks    Status  New    Target Date  08/29/19      PT LONG TERM GOAL #4   Title  Pt to demo improved core and hip strength to at least 4+/5 to improve stability    Time  6    Period  Weeks    Status  New    Target Date  08/29/19            Plan - 08/02/19 1258    Clinical Impression Statement  Pt with improving ability for ther ex, does require verbal and tactile cuing for TA activation. Will continue to work on posture and strenthening.    Personal Factors and Comorbidities  Comorbidity 1    Comorbidities  Osteoporosis, scoliosis,    Examination-Activity Limitations  Bathing;Locomotion Level;Transfers;Bed Mobility;Bend;Sit;Sleep;Squat;Stand;Lift    Examination-Participation Restrictions  Meal Prep;Cleaning;Community Activity;Driving;Shop;Laundry;Yard Work    Copy  Evolving/Moderate complexity    Rehab Potential  Good    PT Frequency  2x / week    PT Duration  6 weeks    PT Treatment/Interventions  ADLs/Self Care Home Management;Cryotherapy;Electrical Stimulation;DME Instruction;Ultrasound;Moist Heat;Iontophoresis 4mg /ml Dexamethasone;Gait training;Stair training;Functional mobility training;Therapeutic activities;Therapeutic exercise;Balance training;Neuromuscular re-education;Manual techniques;Patient/family education;Passive range of motion;Dry needling;Taping;Spinal Manipulations;Joint Manipulations    Consulted and Agree with Plan of Care  Patient       Patient will benefit from skilled therapeutic intervention in order to improve the following deficits and impairments:  Decreased range of motion, Increased muscle spasms, Decreased endurance, Pain, Decreased activity tolerance, Hypomobility, Impaired flexibility, Improper body mechanics, Postural dysfunction, Decreased strength, Decreased mobility  Visit Diagnosis: Chronic bilateral thoracic back pain     Problem List Patient Active Problem List   Diagnosis Date Noted  . Thoracogenic scoliosis of thoracic region 07/16/2019  . 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 Vitiligo 03/31/2018  . Major depression, recurrent, chronic ([place]) 12/12/2017  . Essential hypertension 12/12/2017  . GERD (gastroesophageal reflux disease) 12/12/2017  . History of diverticulitis 12/12/2017  . Osteoporosis 12/12/2017    Lyndee Hensen, PT, DPT 1:20 PM  08/02/19    [facility] [place], [place], [address] Phone: [phone]   Fax:  [phone]  Name: Veronica Myers MRN: [id number] Date of Birth: 12-20-1940

## 2019-08-03 ENCOUNTER — Other Ambulatory Visit: Payer: Self-pay | Admitting: Family Medicine

## 2019-08-06 ENCOUNTER — Ambulatory Visit
Admission: RE | Admit: 2019-08-06 | Discharge: 2019-08-06 | Disposition: A | Discharge status: Home / Self Care | Payer: Medicare PPO | Source: Ambulatory Visit | Attending: Family Medicine | Admitting: Family Medicine

## 2019-08-06 ENCOUNTER — Other Ambulatory Visit: Payer: Self-pay

## 2019-08-06 DIAGNOSIS — S22000A Wedge compression fracture of unspecified thoracic vertebra, initial encounter for closed fracture: Secondary | ICD-10-CM | POA: Diagnosis not present

## 2019-08-07 ENCOUNTER — Ambulatory Visit (INDEPENDENT_AMBULATORY_CARE_PROVIDER_SITE_OTHER): Payer: Medicare PPO | Admitting: Family Medicine

## 2019-08-07 ENCOUNTER — Telehealth: Payer: Self-pay | Admitting: Family Medicine

## 2019-08-07 ENCOUNTER — Ambulatory Visit (INDEPENDENT_AMBULATORY_CARE_PROVIDER_SITE_OTHER): Payer: Medicare PPO | Admitting: Physical Therapy

## 2019-08-07 ENCOUNTER — Encounter: Payer: Self-pay | Admitting: Physical Therapy

## 2019-08-07 ENCOUNTER — Encounter: Payer: Self-pay | Admitting: Family Medicine

## 2019-08-07 VITALS — BP 138/76 | HR 75 | Ht 61.5 in | Wt 143.4 lb

## 2019-08-07 DIAGNOSIS — M546 Pain in thoracic spine: Secondary | ICD-10-CM

## 2019-08-07 DIAGNOSIS — S22000D Wedge compression fracture of unspecified thoracic vertebra, subsequent encounter for fracture with routine healing: Secondary | ICD-10-CM | POA: Insufficient documentation

## 2019-08-07 DIAGNOSIS — G8929 Other chronic pain: Secondary | ICD-10-CM

## 2019-08-07 DIAGNOSIS — S22070D Wedge compression fracture of T9-T10 vertebra, subsequent encounter for fracture with routine healing: Secondary | ICD-10-CM | POA: Diagnosis not present

## 2019-08-07 DIAGNOSIS — S22000A Wedge compression fracture of unspecified thoracic vertebra, initial encounter for closed fracture: Secondary | ICD-10-CM

## 2019-08-07 DIAGNOSIS — M8000XA Age-related osteoporosis with current pathological fracture, unspecified site, initial encounter for fracture: Secondary | ICD-10-CM | POA: Diagnosis not present

## 2019-08-07 NOTE — Progress Notes (Signed)
MRI shows multiple findings however dominant finding is T10 compression fracture which appears to be somewhat new.  This could certainly be the source of your pain.  I will refer to interventional radiology for evaluation of kyphoplasty which is the procedure to stabilize and improve the fracture at T10.  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  You also have some other older compression fractures as well.

## 2019-08-07 NOTE — Patient Instructions (Addendum)
Thank you for coming in today. Continue calcium and vit D.  There is no reason to do the injection (kyphoplasty) if your pain is well controlled.  I do think Prolia is a good idea.  I will talk with Dr Jonni Sanger. Recheck with me as needed.   Denosumab injection What is this medicine? DENOSUMAB (den oh sue mab) slows bone breakdown. Prolia is used to treat osteoporosis in women after menopause and in men, and in people who are taking corticosteroids for 6 months or more. Delton See is used to treat a high calcium level due to cancer and to prevent bone fractures and other bone problems caused by multiple myeloma or cancer bone metastases. Delton See is also used to treat giant cell tumor of the bone. This medicine may be used for other purposes; ask your health care provider or pharmacist if you have questions. COMMON BRAND NAME(S): Prolia, XGEVA What should I tell my health care provider before I take this medicine? They need to know if you have any of these conditions:  dental disease  having surgery or tooth extraction  infection  kidney disease  low levels of calcium or Vitamin D in the blood  malnutrition  on hemodialysis  skin conditions or sensitivity  thyroid or parathyroid disease  an unusual reaction to denosumab, other medicines, foods, dyes, or preservatives  pregnant or trying to get pregnant  breast-feeding How should I use this medicine? This medicine is for injection under the skin. It is given by a health care professional in a hospital or clinic setting. A special MedGuide will be given to you before each treatment. Be sure to read this information carefully each time. For Prolia, talk to your pediatrician regarding the use of this medicine in children. Special care may be needed. For Delton See, talk to your pediatrician regarding the use of this medicine in children. While this drug may be prescribed for children as young as 13 years for selected conditions, precautions do  apply. Overdosage: If you think you have taken too much of this medicine contact a poison control center or emergency room at once. NOTE: This medicine is only for you. Do not share this medicine with others. What if I miss a dose? It is important not to miss your dose. Call your doctor or health care professional if you are unable to keep an appointment. What may interact with this medicine? Do not take this medicine with any of the following medications:  other medicines containing denosumab This medicine may also interact with the following medications:  medicines that lower your chance of fighting infection  steroid medicines like prednisone or cortisone This list may not describe all possible interactions. Give your health care provider a list of all the medicines, herbs, non-prescription drugs, or dietary supplements you use. Also tell them if you smoke, drink alcohol, or use illegal drugs. Some items may interact with your medicine. What should I watch for while using this medicine? Visit your doctor or health care professional for regular checks on your progress. Your doctor or health care professional may order blood tests and other tests to see how you are doing. Call your doctor or health care professional for advice if you get a fever, chills or sore throat, or other symptoms of a cold or flu. Do not treat yourself. This drug may decrease your body's ability to fight infection. Try to avoid being around people who are sick. You should make sure you get enough calcium and vitamin D while  you are taking this medicine, unless your doctor tells you not to. Discuss the foods you eat and the vitamins you take with your health care professional. See your dentist regularly. Brush and floss your teeth as directed. Before you have any dental work done, tell your dentist you are receiving this medicine. Do not become pregnant while taking this medicine or for 5 months after stopping it. Talk  with your doctor or health care professional about your birth control options while taking this medicine. Women should inform their doctor if they wish to become pregnant or think they might be pregnant. There is a potential for serious side effects to an unborn child. Talk to your health care professional or pharmacist for more information. What side effects may I notice from receiving this medicine? Side effects that you should report to your doctor or health care professional as soon as possible:  allergic reactions like skin rash, itching or hives, swelling of the face, lips, or tongue  bone pain  breathing problems  dizziness  jaw pain, especially after dental work  redness, blistering, peeling of the skin  signs and symptoms of infection like fever or chills; cough; sore throat; pain or trouble passing urine  signs of low calcium like fast heartbeat, muscle cramps or muscle pain; pain, tingling, numbness in the hands or feet; seizures  unusual bleeding or bruising  unusually weak or tired Side effects that usually do not require medical attention (report to your doctor or health care professional if they continue or are bothersome):  constipation  diarrhea  headache  joint pain  loss of appetite  muscle pain  runny nose  tiredness  upset stomach This list may not describe all possible side effects. Call your doctor for medical advice about side effects. You may report side effects to FDA at 1-800-FDA-1088. Where should I keep my medicine? This medicine is only given in a clinic, doctor's office, or other health care setting and will not be stored at home. NOTE: This sheet is a summary. It may not cover all possible information. If you have questions about this medicine, talk to your doctor, pharmacist, or health care provider.  2020 Elsevier/Gold Standard (2017-12-23 16:10:44)

## 2019-08-07 NOTE — Therapy (Signed)
[facility] [address] [place], [place], [address] Phone: [phone]   Fax:  [phone]  Physical Therapy Treatment  Patient Details  Name: Veronica Myers MRN: [id number] Date of Birth: 02-18-1941 Referring Provider (PT): Billey Chang   Encounter Date: 08/07/2019  PT End of Session - 08/07/19 1225    Visit Number  5    Number of Visits  12    Date for PT Re-Evaluation  08/29/19    Authorization Type  [employer]    PT Start Time  1216    PT Stop Time  1255    PT Time Calculation (min)  39 min    Activity Tolerance  Patient tolerated treatment well    Behavior During Therapy  [facility] for tasks assessed/performed       Past Medical History:  Diagnosis Date  . Arthritis   . Depression   . Diverticulitis   . GERD (gastroesophageal reflux disease)   . Glaucoma   . Hypertension   . Osteoporosis 12/12/2017   From old medical records. No dexa report attached. Had been on fosamax.   . Vitiligo 03/31/2018    History reviewed. No pertinent surgical history.  There were no vitals filed for this visit.  Subjective Assessment - 08/07/19 1222    Subjective  Pt states back is a bit better, states minimal/no pain at rest. Does have increased pain wtih certain motions, reaching, lifting, up to 4/10 .    Currently in Pain?  Yes    Pain Score  4     Pain Location  Back    Pain Orientation  Right;Left;Mid    Pain Descriptors / Indicators  Aching    Pain Type  Acute pain    Pain Frequency  Intermittent                       [facility] Adult PT Treatment/Exercise - 08/07/19 1226      Posture/Postural Control   Posture Comments  Thoracic kyphosis      Self-Care   Self-Care  Posture      Exercises   Exercises  Lumbar      Lumbar Exercises: Aerobic   Stationary Bike  L1 x 8 min;       Lumbar Exercises: Standing   Functional Squats  10 reps    Functional Squats Limitations  with education on correct bending, squat  mechanics for back pain , 4 lb pick up x5;     Row  20 reps    Theraband Level (Row)  Level 3 (Green)    Other Standing Lumbar Exercises  March x20;       Lumbar Exercises: Seated   Sit to Stand  10 reps      Lumbar Exercises: Supine   Ab Set  15 reps    AB Set Limitations  max education on performance    Clam  20 reps    Clam Limitations  GTB x20;     Bent Knee Raise  20 reps    Bent Knee Raise Limitations  with GTB    Other Supine Lumbar Exercises  TA with:   Shoulder flex cane , 1.5 lb x15 bil;  horizontal abd 2x10 YTB ;       Lumbar Exercises: Sidelying   Hip Abduction  10 reps;Both                  PT Long Term Goals - 07/19/19 1413  PT LONG TERM GOAL #1   Title  Pt to be independent with final HEP for spine health, posture, and  strengthening.    Time  6    Period  Weeks    Status  New    Target Date  08/29/19      PT LONG TERM GOAL #2   Title  Pt to demo ability for optimal seated posture, to decrease pain in back.    Time  6    Period  Weeks    Status  New    Target Date  08/29/19      PT LONG TERM GOAL #3   Title  Pt to demo ability for squat, bend/lift with correct mechanics for improved ability and safety with IADLs.    Time  6    Period  Weeks    Status  New    Target Date  08/29/19      PT LONG TERM GOAL #4   Title  Pt to demo improved core and hip strength to at least 4+/5 to improve stability    Time  6    Period  Weeks    Status  New    Target Date  08/29/19            Plan - 08/07/19 1243    Clinical Impression Statement  Pt improving with ability for ther ex and for self correcting posture. She demonstrates good understanding of proper seated posture, as well as for bend, squat today. Pt to benefit from continued strenthening.    Personal Factors and Comorbidities  Comorbidity 1    Comorbidities  Osteoporosis, scoliosis,    Examination-Activity Limitations  Bathing;Locomotion Level;Transfers;Bed  Mobility;Bend;Sit;Sleep;Squat;Stand;Lift    Examination-Participation Restrictions  Meal Prep;Cleaning;Community Activity;Driving;Shop;Laundry;Yard Work    Conservation officer, historic buildings  Evolving/Moderate complexity    Rehab Potential  Good    PT Frequency  2x / week    PT Duration  6 weeks    PT Treatment/Interventions  ADLs/Self Care Home Management;Cryotherapy;Electrical Stimulation;DME Instruction;Ultrasound;Moist Heat;Iontophoresis 4mg /ml Dexamethasone;Gait training;Stair training;Functional mobility training;Therapeutic activities;Therapeutic exercise;Balance training;Neuromuscular re-education;Manual techniques;Patient/family education;Passive range of motion;Dry needling;Taping;Spinal Manipulations;Joint Manipulations    Consulted and Agree with Plan of Care  Patient       Patient will benefit from skilled therapeutic intervention in order to improve the following deficits and impairments:  Decreased range of motion, Increased muscle spasms, Decreased endurance, Pain, Decreased activity tolerance, Hypomobility, Impaired flexibility, Improper body mechanics, Postural dysfunction, Decreased strength, Decreased mobility  Visit Diagnosis: Chronic bilateral thoracic back pain     Problem List Patient Active Problem List   Diagnosis Date Noted  . Thoracogenic scoliosis of thoracic region 07/16/2019  . 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 Vitiligo 03/31/2018  . Major depression, recurrent, chronic ([facility]) 12/12/2017  . Essential hypertension 12/12/2017  . GERD (gastroesophageal reflux disease) 12/12/2017  . History of diverticulitis 12/12/2017  . Osteoporosis 12/12/2017    12/14/2017, PT, DPT 12:59 PM  08/07/19    [facility] [address] [place], [place], [place] Phone: [phone]   Fax:  [phone]  Name: Alechia Lezama MRN: [name] Date of Birth: 02-Feb-1941

## 2019-08-07 NOTE — Progress Notes (Signed)
Please set up for prolia injections and notify pt when ready.

## 2019-08-07 NOTE — Progress Notes (Signed)
I, Christoper FabianMolly Weber, LAT, ATC, am serving as scribe for Dr. Clementeen GrahamEvan Janica Eldred.  Veronica Myers is a 78 y.o. female who presents to [facility] today for f/u of R-sided T-spine pain and T-spine MRI review.  Pt was last seen by Dr. Denyse Amassorey on 07/30/19 and noted her pain to be intermittent, sharp pain of moderate intensity.  She has been attending PT w/ [place] MutaLauren Carroll at [facility]. She had her thoracic MRI on 08/06/19 that indicates a subacute T10 compression fracture w/ trace bony retropulsion and a chronic, severe T6 compression fracture and 6mm bony retropulsion.  Since her last visit, pt notes 70-80% improvement.  Pt notes that she is only having pain w/ movement at this point, particularly w/ t-spine rotation and flexion but has no pain at rest.  She notes that she has started sleeping w/ a pillow between her knees which has helped w/ her ability to sleep.    ROS:  As above  Exam:  BP 138/76 (BP Location: Left Arm, Patient Position: Sitting, Cuff Size: Normal)    Pulse 75    Ht 5' 1.5" (1.562 m)    Wt 143 lb 6.4 oz (65 kg)    SpO2 96%    BMI 26.66 kg/m  Wt Readings from Last 5 Encounters:  08/07/19 143 lb 6.4 oz (65 kg)  07/30/19 143 lb 9.6 oz (65.1 kg)  07/16/19 141 lb (64 kg)  06/24/19 145 lb 8.1 oz (66 kg)  07/13/18 145 lb 6.4 oz (66 kg)   General: Well Developed, well nourished, and in no acute distress.  Neuro/Psych: Alert and oriented x3, extra-ocular muscles intact, able to move all 4 extremities, sensation grossly intact. Skin: Warm and dry, no rashes noted.  Respiratory: Not using accessory muscles, speaking in full sentences, trachea midline.  Cardiovascular: Pulses palpable, no extremity edema. Abdomen: Does not appear distended. MSK: T-spine: Not particularly tender midline. Decreased thoracic motion.  Normal gait.    Lab and Radiology Results No results found for this or any previous visit (from the past 72 hour(s)). Mr Thoracic Spine Wo Contrast  Result Date:  08/06/2019 CLINICAL DATA:  Compression fracture of body of thoracic vertebra. Prevertebral plasty or kyphoplasty, thoracic spine. EXAM: MRI THORACIC SPINE WITHOUT CONTRAST TECHNIQUE: Multiplanar, multisequence MR imaging of the thoracic spine was performed. No intravenous contrast was administered. COMPARISON:  Radiographs of the thoracic spine 07/18/2019. FINDINGS: Incompletely assessed cervical spondylosis seen on scout localizer imaging. C4-C5 grade 1 anterolisthesis. Alignment: 6 mm bony retropulsion at the T6 level. Trace bony retropulsion at the level of the T10 superior endplate. Trace bony retropulsion also present at the level of the L2 superior endplate. Exaggerated thoracic kyphosis centered at the T6 level. Vertebrae: Redemonstrated severe T6 compression fracture (>80% height loss). No associated marrow edema at this site, consistent with chronic fracture. Redemonstrated T10 anterior wedge compression fracture open (~40% height loss). There is edema signal throughout the T10 vertebral body extending slightly into the pedicles, consistent with subacute fracture. No progressive height loss since thoracic spine radiographs 07/18/2019. Mild edema signal within the T11 superior endplate is likely degenerative. Mild chronic T12 superior endplate compression deformity with superimposed Schmorl node. Thoracic vertebral body height is otherwise maintained. Mild chronic L2 anterior wedge compression fracture. Cord:  No spinal cord signal abnormality. Paraspinal and other soft tissues: No abnormality within included portions of the thorax or visualized upper abdomen. Paraspinal soft tissues within normal limits. Disc levels: Mild multilevel disc degeneration. Facet hypertrophy, greatest within the lower thoracic spine.  Small multilevel disc bulges. Small T2-T3 central disc protrusion without significant canal stenosis. Bony retropulsion at the T6 level mildly effaces the ventral thecal sac, although without  significant central canal stenosis. At T6-T7, bony retropulsion and associated disc bulge contributes to mild right and moderate/severe left neural foraminal narrowing. Small T7-T8 central disc protrusion without significant canal stenosis. Small T8-T9 left center disc protrusion without significant canal stenosis. Minimal bony retropulsion at the level of the T10 superior endplate without significant canal stenosis. Small T12-L1 central disc protrusion without significant canal stenosis. At L1-L2, minimal bony retropulsion at the level of the L2 superior endplate with associated disc bulge and small left subarticular/foraminal disc protrusion. The disc protrusion contributes to mild left subarticular narrowing without significant central canal stenosis. IMPRESSION: Subacute T10 compression fracture (~40% height loss) and only trace bony retropulsion. Persistent edema throughout the T10 vertebral body and extending into the bilateral pedicles. No progressive height loss since radiographs 07/18/2019. Chronic severe T6 compression fracture (>80% height loss). 6 mm bony retropulsion at this level partially effaces the ventral thecal sac, although without significant central canal stenosis. At T6-T7, bony retropulsion and disc bulge contribute to bilateral neural foraminal narrowing (mild right, moderate/severe left). Mild chronic superior endplate compression fractures at T12 and L2. Superimposed thoracic spondylosis as described. No significant central canal stenosis at any level. At L1-L2, mild bony retropulsion, disc bulge and left subarticular/foraminal disc protrusion. Resultant mild left subarticular narrowing without significant central canal or neural foraminal stenosis. Electronically Signed   By: Jackey Loge DO   On: 08/06/2019 13:34   I, Clementeen Graham, personally (independently) visualized and performed the interpretation of the images attached in this note.     Assessment and Plan: 78 y.o. female with  subacute T10 thoracic compression fracture.  Primary source of pain in mid back.  Fortunately patient has had considerable pain response to physical therapy.  I originally had planned for vertebroplasty or kyphoplasty but given her considerable response with physical therapy I do not see the need for it at this time.  Certainly that is a possibility if her pain returns in the near future during or following physical therapy.  She does however have multiple other chronic older compression fractures in her spine and is at high risk for subsequent compression fractures or other osteoporotic fractures given her significant osteoporosis with T score -4.1 with DEXA scan in 2019.  I obtain labs at the last visit which showed adequate calcium and vitamin D and normal PTH level.  She has had trials of bisphosphonate therapy in the past.  After discussion with patient she mentions that her primary care provider Dr. Mardelle Matte had mentioned " an injection every 6 months" but patient was reluctant to consider it initially.  After discussion further about Prolia she is willing to consider it.  Will discuss with PCP likely patient will be a good candidate for Prolia.  I spent 15 minutes with this patient, greater than 50% was face-to-face time counseling regarding MRI findings, DDX, treatment options and prolia .   Historical information moved to improve visibility of documentation.  Past Medical History:  Diagnosis Date   Arthritis    Depression    Diverticulitis    GERD (gastroesophageal reflux disease)    Glaucoma    Hypertension    Osteoporosis 12/12/2017   From old medical records. No dexa report attached. Had been on fosamax.    Vitiligo 03/31/2018   No past surgical history on file. Social History  Tobacco Use   Smoking status: Never Smoker   Smokeless tobacco: Never Used  Substance Use Topics   Alcohol use: Not Currently   family history includes Arthritis in her father and mother;  Depression in her father and mother; Diabetes in her father and mother; Heart disease in her father and mother; Hypertension in her father and mother.  Medications: Current Outpatient Medications  Medication Sig Dispense Refill   CALCIUM CARB-MAGNESIUM CARB PO Take by mouth.     Ginkgo Biloba 120 MG CAPS Take by mouth.     metoprolol succinate (TOPROL-XL) 50 MG 24 hr tablet Take 1 tablet (50 mg total) by mouth daily. 90 tablet 3   Multiple Vitamins-Minerals (ZINC PO) Take by mouth.     Omega-3 Krill Oil 500 MG CAPS Take by mouth.     DULoxetine (CYMBALTA) 30 MG capsule      GLUCOSAMINE-CHONDROIT-MSM-C-MN PO Take by mouth.     ibandronate (BONIVA) 150 MG tablet Take 1 tablet (150 mg total) by mouth every 30 (thirty) days. With water, empty stomach, sit up for 30 minutes (Patient not taking: Reported on 08/07/2019) 3 tablet 3   omeprazole (PRILOSEC) 20 MG capsule Take 1 capsule (20 mg total) by mouth daily. (Patient not taking: Reported on 08/07/2019) 90 capsule 1   Probiotic Product (ALIGN PO) Take by mouth.     No current facility-administered medications for this visit.    No Known Allergies    Discussed warning signs or symptoms. Please see discharge instructions. Patient expresses understanding.  The above documentation has been reviewed and is accurate and complete Lynne Leader

## 2019-08-07 NOTE — Telephone Encounter (Signed)
Pt seeing Dr. Georgina Snell on 08/07/19 at 1 pm to discuss MRI results as well as referral to interventional radiology.

## 2019-08-07 NOTE — Telephone Encounter (Signed)
Referral to interventional radiology for kyphoplasty ordered in response to T10 compression fracture seen on MRI.

## 2019-08-09 ENCOUNTER — Other Ambulatory Visit: Payer: Self-pay

## 2019-08-09 ENCOUNTER — Ambulatory Visit (INDEPENDENT_AMBULATORY_CARE_PROVIDER_SITE_OTHER): Payer: Medicare PPO | Admitting: Physical Therapy

## 2019-08-09 ENCOUNTER — Encounter: Payer: Self-pay | Admitting: Physical Therapy

## 2019-08-09 DIAGNOSIS — M546 Pain in thoracic spine: Secondary | ICD-10-CM | POA: Diagnosis not present

## 2019-08-09 DIAGNOSIS — G8929 Other chronic pain: Secondary | ICD-10-CM

## 2019-08-09 NOTE — Therapy (Signed)
[facility] [address] [place], [place], [address] Phone: [phone]   Fax:  [phone]  Physical Therapy Treatment  Patient Details  Name: Veronica MusterMaria Myers MRN: [id number] Date of Birth: Nov 01, 1940 Referring Provider (PT): Asencion Partridgecamille Andy   Encounter Date: 08/09/2019  PT End of Session - 08/09/19 1252    Visit Number  6    Number of Visits  12    Date for PT Re-Evaluation  08/29/19    Authorization Type  [employer]    PT Start Time  1215    PT Stop Time  1253    PT Time Calculation (min)  38 min    Activity Tolerance  Patient tolerated treatment well    Behavior During Therapy  [facility] for tasks assessed/performed       Past Medical History:  Diagnosis Date  . Arthritis   . Depression   . Diverticulitis   . GERD (gastroesophageal reflux disease)   . Glaucoma   . Hypertension   . Osteoporosis 12/12/2017   From old medical records. No dexa report attached. Had been on fosamax.   . Vitiligo 03/31/2018    History reviewed. No pertinent surgical history.  There were no vitals filed for this visit.  Subjective Assessment - 08/09/19 1250    Subjective  Pt states minimal pain in back, doing well with HEP. Does have pain with certain movements.    Currently in Pain?  Yes    Pain Score  4     Pain Location  Back    Pain Orientation  Right;Left;Mid    Pain Descriptors / Indicators  Aching    Pain Type  Acute pain    Pain Radiating Towards  up to 4/10 only with certain movments.    Pain Onset  More than a month ago    Pain Frequency  Intermittent    Aggravating Factors   activity    Pain Relieving Factors  rest                       [facility] Adult PT Treatment/Exercise - 08/09/19 1225      Posture/Postural Control   Posture Comments  Thoracic kyphosis      Self-Care   Self-Care  Posture      Exercises   Exercises  Lumbar      Lumbar Exercises: Stretches   Single Knee to Chest Stretch  2 reps;30  seconds;Right;Left      Lumbar Exercises: Aerobic   Stationary Bike  --    UBE (Upper Arm Bike)  L1 x 5 min;       Lumbar Exercises: Standing   Functional Squats  10 reps    Functional Squats Limitations  with education on correct bending,     Row  20 reps    Theraband Level (Row)  Level 3 (Green)    Other Standing Lumbar Exercises  March x20, 2lb;   Hip abd 2x10 bil, 2lb;       Lumbar Exercises: Seated   Sit to Stand  10 reps      Lumbar Exercises: Supine   Ab Set  --    AB Set Limitations  --    Clam  20 reps    Clam Limitations  GTB x20;     Bent Knee Raise  20 reps    Bent Knee Raise Limitations  with GTB    Straight Leg Raise  15 reps    Straight Leg Raises  Limitations  bil    Other Supine Lumbar Exercises  TA with horizontal abd 2x10 YTB ;       Lumbar Exercises: Sidelying   Hip Abduction  10 reps;Both                  PT Long Term Goals - 07/19/19 1413      PT LONG TERM GOAL #1   Title  Pt to be independent with final HEP for spine health, posture, and  strengthening.    Time  6    Period  Weeks    Status  New    Target Date  08/29/19      PT LONG TERM GOAL #2   Title  Pt to demo ability for optimal seated posture, to decrease pain in back.    Time  6    Period  Weeks    Status  New    Target Date  08/29/19      PT LONG TERM GOAL #3   Title  Pt to demo ability for squat, bend/lift with correct mechanics for improved ability and safety with IADLs.    Time  6    Period  Weeks    Status  New    Target Date  08/29/19      PT LONG TERM GOAL #4   Title  Pt to demo improved core and hip strength to at least 4+/5 to improve stability    Time  6    Period  Weeks    Status  New    Target Date  08/29/19            Plan - 08/09/19 1253    Clinical Impression Statement  Pt improving with strengthening. Plan to see pt next week, then will d/c to HEP. Pt with questions on mechanics of exercises for HEP, and requires verbal and tactile cuing for  this today. Will continue strengthening and posture education.    Personal Factors and Comorbidities  Comorbidity 1    Comorbidities  Osteoporosis, scoliosis,    Examination-Activity Limitations  Bathing;Locomotion Level;Transfers;Bed Mobility;Bend;Sit;Sleep;Squat;Stand;Lift    Examination-Participation Restrictions  Meal Prep;Cleaning;Community Activity;Driving;Shop;Laundry;Yard Work    [employer] officer  Evolving/Moderate complexity    Rehab Potential  Good    PT Frequency  2x / week    PT Duration  6 weeks    PT Treatment/Interventions  ADLs/Self Care Home Management;Cryotherapy;Electrical Stimulation;DME Instruction;Ultrasound;Moist Heat;Iontophoresis 4mg /ml Dexamethasone;Gait training;Stair training;Functional mobility training;Therapeutic activities;Therapeutic exercise;Balance training;Neuromuscular re-education;Manual techniques;Patient/family education;Passive range of motion;Dry needling;Taping;Spinal Manipulations;Joint Manipulations    Consulted and Agree with Plan of Care  Patient       Patient will benefit from skilled therapeutic intervention in order to improve the following deficits and impairments:  Decreased range of motion, Increased muscle spasms, Decreased endurance, Pain, Decreased activity tolerance, Hypomobility, Impaired flexibility, Improper body mechanics, Postural dysfunction, Decreased strength, Decreased mobility  Visit Diagnosis: Chronic bilateral thoracic back pain     Problem List Patient Active Problem List   Diagnosis Date Noted  . Compression fracture of thoracic vertebra with routine healing 08/07/2019  . Thoracogenic scoliosis of thoracic region 07/16/2019  . 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 Vitiligo 03/31/2018  . Major depression, recurrent, chronic ([place]) 12/12/2017  . Essential hypertension 12/12/2017  . GERD (gastroesophageal reflux disease) 12/12/2017  . History of diverticulitis 12/12/2017  .  Osteoporosis 12/12/2017    Lyndee Hensen, PT, DPT 12:55 PM  08/09/19    [facility] [phone]  [address] [place], [place], [address] Phone: [phone]   Fax:  [phone]  Name: Carlee Vonderhaar MRN: [id number] Date of Birth: 08-26-1941

## 2019-08-14 ENCOUNTER — Encounter: Payer: Self-pay | Admitting: Physical Therapy

## 2019-08-14 ENCOUNTER — Other Ambulatory Visit: Payer: Self-pay

## 2019-08-14 ENCOUNTER — Ambulatory Visit (INDEPENDENT_AMBULATORY_CARE_PROVIDER_SITE_OTHER): Payer: Medicare PPO | Admitting: Physical Therapy

## 2019-08-14 DIAGNOSIS — M546 Pain in thoracic spine: Secondary | ICD-10-CM | POA: Diagnosis not present

## 2019-08-14 DIAGNOSIS — G8929 Other chronic pain: Secondary | ICD-10-CM | POA: Diagnosis not present

## 2019-08-14 NOTE — Therapy (Signed)
[facility] [address] [place], [place], [address] Phone: [phone]   Fax:  [phone]  Physical Therapy Treatment/Discharge   Patient Details  Name: Veronica Myers MRN: [id number] Date of Birth: 11/06/1940 Referring Provider (PT): Billey Chang   Encounter Date: 08/14/2019  PT End of Session - 08/14/19 1226    Visit Number  7    Number of Visits  12    Date for PT Re-Evaluation  08/29/19    Authorization Type  [employer] Medicare    PT Start Time  1215    PT Stop Time  1301    PT Time Calculation (min)  46 min    Activity Tolerance  Patient tolerated treatment well    Behavior During Therapy  [facility] for tasks assessed/performed       Past Medical History:  Diagnosis Date  . Arthritis   . Depression   . Diverticulitis   . GERD (gastroesophageal reflux disease)   . Glaucoma   . Hypertension   . Osteoporosis 12/12/2017   From old medical records. No dexa report attached. Had been on fosamax.   . Vitiligo 03/31/2018    History reviewed. No pertinent surgical history.  There were no vitals filed for this visit.  Subjective Assessment - 08/14/19 1224    Subjective  Pt states mild soreness over the weekend, but otherwise has had very little pain. Has been able to do a bit of housework, and regular activities.    Patient Stated Goals  decreasd pain, increased activity    Currently in Pain?  No/denies    Pain Score  0-No pain                       [facility] Adult PT Treatment/Exercise - 08/14/19 1227      Posture/Postural Control   Posture Comments  Thoracic kyphosis      Self-Care   Self-Care  Posture      Exercises   Exercises  Lumbar      Lumbar Exercises: Stretches   Single Knee to Chest Stretch  2 reps;30 seconds;Right;Left      Lumbar Exercises: Aerobic   Stationary Bike  L1 x8 min     UBE (Upper Arm Bike)  --      Lumbar Exercises: Standing   Functional Squats  --    Functional Squats  Limitations  --    Row  20 reps    Theraband Level (Row)  Level 3 (Green)    Other Standing Lumbar Exercises  March x20, 2lb;   Hip abd 2x10 bil, 2lb;       Lumbar Exercises: Seated   Sit to Stand  20 reps      Lumbar Exercises: Supine   Clam  20 reps    Clam Limitations  GTB x20;     Bent Knee Raise  20 reps    Bent Knee Raise Limitations  with GTB    Straight Leg Raise  10 reps    Straight Leg Raises Limitations  bil    Other Supine Lumbar Exercises  --      Lumbar Exercises: Sidelying   Hip Abduction  10 reps;Both             PT Education - 08/14/19 1226    Education Details  Reviewed final HEP , discharge    Person(s) Educated  Patient    Methods  Explanation;Demonstration;Verbal cues;Handout    Comprehension  Verbalized understanding;Returned demonstration;Verbal cues required  PT Long Term Goals - 08/14/19 1404      PT LONG TERM GOAL #1   Title  Pt to be independent with final HEP for spine health, posture, and  strengthening.    Time  6    Period  Weeks    Status  Achieved      PT LONG TERM GOAL #2   Title  Pt to demo ability for optimal seated posture, to decrease pain in back.    Time  6    Period  Weeks    Status  Achieved      PT LONG TERM GOAL #3   Title  Pt to demo ability for squat, bend/lift with correct mechanics for improved ability and safety with IADLs.    Time  6    Period  Weeks    Status  Achieved      PT LONG TERM GOAL #4   Title  Pt to demo improved core and hip strength to at least 4+/5 to improve stability    Time  6    Period  Weeks    Status  Achieved            Plan - 08/14/19 1408    Clinical Impression Statement  Pt doing well with ther ex, has met goals at this time, ready for d/c to HEP. SHe does have several questions today on her dx, HEP, and exercise progression, despite education at multiple sessions previously. Final HEP reviewed in detail today, pt states understanding.    Personal Factors and  Comorbidities  Comorbidity 1    Comorbidities  Osteoporosis, scoliosis,    Examination-Activity Limitations  Bathing;Locomotion Level;Transfers;Bed Mobility;Bend;Sit;Sleep;Squat;Stand;Lift    Examination-Participation Restrictions  Meal Prep;Cleaning;Community Activity;Driving;Shop;Laundry;Yard Work    [employer]chant [employer] officer  Evolving/Moderate complexity    Rehab Potential  Good    PT Frequency  2x / week    PT Duration  6 weeks    PT Treatment/Interventions  ADLs/Self Care Home Management;Cryotherapy;Electrical Stimulation;DME Instruction;Ultrasound;Moist Heat;Iontophoresis 79m/ml Dexamethasone;Gait training;Stair training;Functional mobility training;Therapeutic activities;Therapeutic exercise;Balance training;Neuromuscular re-education;Manual techniques;Patient/family education;Passive range of motion;Dry needling;Taping;Spinal Manipulations;Joint Manipulations    Consulted and Agree with Plan of Care  Patient       Patient will benefit from skilled therapeutic intervention in order to improve the following deficits and impairments:  Decreased range of motion, Increased muscle spasms, Decreased endurance, Pain, Decreased activity tolerance, Hypomobility, Impaired flexibility, Improper body mechanics, Postural dysfunction, Decreased strength, Decreased mobility  Visit Diagnosis: Chronic bilateral thoracic back pain     Problem List Patient Active Problem List   Diagnosis Date Noted  . Compression fracture of thoracic vertebra with routine healing 08/07/2019  . Thoracogenic scoliosis of thoracic region 07/16/2019  . 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 Vitiligo 03/31/2018  . Major depression, recurrent, chronic ([place] 12/12/2017  . Essential hypertension 12/12/2017  . GERD (gastroesophageal reflux disease) 12/12/2017  . History of diverticulitis 12/12/2017  . Osteoporosis 12/12/2017   Veronica Myers PT, DPT 2:13 PM  08/14/19    [place] [address]Phone: [phone]  Fax:  [phone] Name: Veronica AjaMRN: [id number] of Birth: [birth year]  PHYSICAL THERAPY DISCHARGE SUMMARY Visits from start of Care: 7   Plan: Patient agrees to discharge.  Patient goals were met. Patient is being discharged due to meeting the stated rehab goals.  ?????      Veronica Myers PT, DPT 2:13 PM  08/14/19

## 2019-08-14 NOTE — Patient Instructions (Signed)
Access Code: [email or web address]  URL: [email or web address]  Date: 08/14/2019  Prepared by: Lyndee Hensen   Exercises Supine Transversus Abdominis Bracing - Hands on Ground - 10 reps - 2 sets - 1x daily Supine Shoulder Flexion with Dowel - 10 reps - 1 sets - 1x daily Supine March - 10 reps - 2 sets - 1x daily Hooklying Clamshell with Resistance - 10 reps - 2 sets - 1x daily Supine Shoulder Horizontal Abduction with Resistance - 10 reps - 2 sets Scapular Retraction with Resistance - 10 reps - 2 sets - 1x daily Standing March with Counter Support - 10 reps - 2 sets - 1x daily Standing Hip Abduction - 10 reps - 2 sets - 1x daily Sidelying Hip Abduction - 10 reps - 2 sets - 1x daily Sit to Stand - 10 reps - 1 sets - 1x daily

## 2019-08-16 ENCOUNTER — Encounter: Payer: Medicare PPO | Admitting: Physical Therapy

## 2019-08-21 ENCOUNTER — Ambulatory Visit: Payer: Medicare PPO | Admitting: Family Medicine

## 2019-08-21 ENCOUNTER — Telehealth: Payer: Self-pay

## 2019-08-21 NOTE — Telephone Encounter (Signed)
Benefit verification is in progress for this patient.

## 2019-08-23 ENCOUNTER — Encounter: Payer: Self-pay | Admitting: Family Medicine

## 2019-08-27 NOTE — Telephone Encounter (Signed)
PA for Prolia approved through 08/29/2020.

## 2019-08-29 ENCOUNTER — Telehealth: Payer: Self-pay

## 2019-08-29 NOTE — Telephone Encounter (Signed)
Prolia approved for 20% out of pocket.  PA approved through 08/30/2019.

## 2019-09-07 ENCOUNTER — Other Ambulatory Visit: Payer: Self-pay

## 2019-09-10 ENCOUNTER — Ambulatory Visit (INDEPENDENT_AMBULATORY_CARE_PROVIDER_SITE_OTHER): Payer: Medicare HMO

## 2019-09-10 ENCOUNTER — Encounter: Payer: Self-pay | Admitting: Family Medicine

## 2019-09-10 ENCOUNTER — Other Ambulatory Visit: Payer: Self-pay

## 2019-09-10 ENCOUNTER — Ambulatory Visit (INDEPENDENT_AMBULATORY_CARE_PROVIDER_SITE_OTHER): Payer: Medicare HMO | Admitting: Family Medicine

## 2019-09-10 VITALS — BP 126/64 | HR 76 | Temp 97.2°F | Ht 62.0 in | Wt 149.4 lb

## 2019-09-10 DIAGNOSIS — Z0000 Encounter for general adult medical examination without abnormal findings: Secondary | ICD-10-CM | POA: Diagnosis not present

## 2019-09-10 DIAGNOSIS — M8000XD Age-related osteoporosis with current pathological fracture, unspecified site, subsequent encounter for fracture with routine healing: Secondary | ICD-10-CM

## 2019-09-10 DIAGNOSIS — I10 Essential (primary) hypertension: Secondary | ICD-10-CM

## 2019-09-10 DIAGNOSIS — F339 Major depressive disorder, recurrent, unspecified: Secondary | ICD-10-CM

## 2019-09-10 DIAGNOSIS — M4134 Thoracogenic scoliosis, thoracic region: Secondary | ICD-10-CM

## 2019-09-10 DIAGNOSIS — K219 Gastro-esophageal reflux disease without esophagitis: Secondary | ICD-10-CM

## 2019-09-10 DIAGNOSIS — L80 Vitiligo: Secondary | ICD-10-CM | POA: Diagnosis not present

## 2019-09-10 LAB — COMPREHENSIVE METABOLIC PANEL
ALT: 11 U/L (ref 0–35)
AST: 16 U/L (ref 0–37)
Albumin: 3.9 g/dL (ref 3.5–5.2)
Alkaline Phosphatase: 91 U/L (ref 39–117)
BUN: 13 mg/dL (ref 6–23)
CO2: 30 mEq/L (ref 19–32)
Calcium: 9 mg/dL (ref 8.4–10.5)
Chloride: 103 mEq/L (ref 96–112)
Creatinine, Ser: 0.76 mg/dL (ref 0.40–1.20)
GFR: 73.77 mL/min (ref >60.00)
Glucose, Bld: 106 mg/dL — ABNORMAL HIGH (ref 70–99)
Potassium: 3.9 mEq/L (ref 3.5–5.1)
Sodium: 140 mEq/L (ref 135–145)
Total Bilirubin: 0.4 mg/dL (ref 0.2–1.2)
Total Protein: 6.4 g/dL (ref 6.0–8.3)

## 2019-09-10 LAB — LIPID PANEL
Cholesterol: 194 mg/dL (ref 0–200)
HDL: 64.9 mg/dL (ref >39.00)
LDL Cholesterol: 100 mg/dL — ABNORMAL HIGH (ref 0–99)
NonHDL: 129.16
Total CHOL/HDL Ratio: 3
Triglycerides: 146 mg/dL (ref 0.0–149.0)
VLDL: 29.2 mg/dL (ref 0.0–40.0)

## 2019-09-10 LAB — VITAMIN D 25 HYDROXY (VIT D DEFICIENCY, FRACTURES): VITD: 64.08 ng/mL (ref 30.00–100.00)

## 2019-09-10 LAB — TSH: TSH: 2.15 u[IU]/mL (ref 0.35–4.50)

## 2019-09-10 MED ORDER — FLUOXETINE HCL 20 MG PO TABS: ORAL_TABLET | ORAL | 2 refills | Status: DC

## 2019-09-10 MED ORDER — PANTOPRAZOLE SODIUM 20 MG PO TBEC: 20.0000 mg | DELAYED_RELEASE_TABLET | Freq: Every day | ORAL | 5 refills | Status: DC

## 2019-09-10 NOTE — Patient Instructions (Addendum)
Please return in 6 weeks to recheck your mood.   I will release your lab results to you on your MyChart account with further instructions. Please reply with any questions.   Please schedule your mammogram. Please schedule your eye exam.   If you have any questions or concerns, please don't hesitate to send me a message via MyChart or call the office at [phone]. Thank you for visiting with [place] today! It's our pleasure caring for you.  We are starting 2 new pills: prozac for mood and protonix for stomach pain/acid.  Also, we will try to get you started/authorized for the Prolia injections for your osteoporosis.

## 2019-09-10 NOTE — Progress Notes (Signed)
Subjective:   Veronica Myers is a 79 y.o. female who presents for Medicare Annual (Subsequent) preventive examination.  Review of Systems:   Cardiac Risk Factors include: advanced age (>66men, >65 women);hypertension    Objective:     Vitals: BP 126/64   Pulse 76   Temp (!) 97.2 F (36.2 C) (Temporal)   Ht 5\' 2"  (1.575 m)   Wt 149 lb 6.4 oz (67.8 kg)   BMI 27.33 kg/m   Body mass index is 27.33 kg/m.  Advanced Directives 09/10/2019 07/20/2019 07/18/2019 06/24/2019  Does Patient Have a Medical Advance Directive? No No No No  Would patient like information on creating a medical advance directive? No - Patient declined No - Patient declined No - Guardian declined Yes (ED - Information included in AVS)    Tobacco Social History   Tobacco Use  Smoking Status Never Smoker  Smokeless Tobacco Never Used     Counseling given: Not Answered   Clinical Intake:  Pre-visit preparation completed: Yes  Pain Type: Acute pain Pain Location: Back Pain Orientation: Lower Pain Descriptors / Indicators: Aching Pain Onset: More than a month ago Pain Frequency: Intermittent  Diabetes: No  How often do you need to have someone help you when you read instructions, pamphlets, or other written materials from your doctor or pharmacy?: 1 - Never  Interpreter Needed?: No  Information entered by :: [phone] LPN  Past Medical History:  Diagnosis Date  . Arthritis   . Depression   . Diverticulitis   . GERD (gastroesophageal reflux disease)   . Glaucoma   . Hypertension   . Osteoporosis 12/12/2017   From old medical records. No dexa report attached. Had been on fosamax.   . Vitiligo 03/31/2018   History reviewed. No pertinent surgical history. Family History  Problem Relation Age of Onset  . Arthritis Mother   . Depression Mother   . Diabetes Mother   . Heart disease Mother   . Hypertension Mother   . Arthritis Father   . Depression Father   . Diabetes Father   .  Heart disease Father   . Hypertension Father    Social History   Socioeconomic History  . Marital status: Widowed    Spouse name: Not on file  . Number of children: 3  . Years of education: Not on file  . Highest education level: Not on file  Occupational History  . Not on file  Tobacco Use  . Smoking status: Never Smoker  . Smokeless tobacco: Never Used  Substance and Sexual Activity  . Alcohol use: Not Currently  . Drug use: Never  . Sexual activity: Not Currently  Other Topics Concern  . Not on file  Social History Narrative  . Not on file   Social Determinants of Health   Financial Resource Strain:   . Difficulty of Paying Living Expenses: Not on file  Food Insecurity:   . Worried About 05/31/2018 in the Last Year: Not on file  . Ran Out of Food in the Last Year: Not on file  Transportation Needs:   . Lack of Transportation (Medical): Not on file  . Lack of Transportation (Non-Medical): Not on file  Physical Activity:   . Days of Exercise per Week: Not on file  . Minutes of Exercise per Session: Not on file  Stress:   . Feeling of Stress : Not on file  Social Connections:   . Frequency of Communication with Friends and Family:  Not on file  . Frequency of Social Gatherings with Friends and Family: Not on file  . Attends Religious Services: Not on file  . Active Member of Clubs or Organizations: Not on file  . Attends Banker Meetings: Not on file  . Marital Status: Not on file    Outpatient Encounter Medications as of 09/10/2019  Medication Sig  . CALCIUM CARB-MAGNESIUM CARB PO Take by mouth.  . Ginkgo Biloba 120 MG CAPS Take by mouth.  Marland Kitchen GLUCOSAMINE-CHONDROIT-MSM-C-MN PO Take by mouth.  . metoprolol succinate (TOPROL-XL) 50 MG 24 hr tablet Take 1 tablet (50 mg total) by mouth daily.  . Multiple Vitamins-Minerals (ZINC PO) Take by mouth.  . Omega-3 Krill Oil 500 MG CAPS Take by mouth.  . Probiotic Product (ALIGN PO) Take by mouth.  .  DULoxetine (CYMBALTA) 30 MG capsule   . omeprazole (PRILOSEC) 20 MG capsule Take 1 capsule (20 mg total) by mouth daily. (Patient not taking: Reported on 08/07/2019)   No facility-administered encounter medications on file as of 09/10/2019.    Activities of Daily Living In your present state of health, do you have any difficulty performing the following activities: 09/10/2019  Hearing? N  Vision? N  Difficulty concentrating or making decisions? N  Walking or climbing stairs? N  Dressing or bathing? N  Doing errands, shopping? N  Preparing Food and eating ? N  Using the Toilet? N  In the past six months, have you accidently leaked urine? N  Do you have problems with loss of bowel control? N  Managing your Medications? N  Managing your Finances? N  Housekeeping or managing your Housekeeping? N  Some recent data might be hidden    Patient Care Team: Willow Ora, MD as PCP - General (Family Medicine)    Assessment:   This is a routine wellness examination for [facility].  Exercise Activities and Dietary recommendations Current Exercise Habits: Home exercise routine, Type of exercise: stretching, Time (Minutes): 30, Frequency (Times/Week): 3, Weekly Exercise (Minutes/Week): 90, Intensity: Mild  Goals   None     Fall Risk Fall Risk  09/10/2019 01/24/2018  Falls in the past year? 0 Yes  Number falls in past yr: - 2 or more  Injury with Fall? 0 Yes  Comment - Broke left arm  Follow up Falls evaluation completed;Education provided;Falls prevention discussed -   Is the patient's home free of loose throw rugs in walkways, pet beds, electrical cords, etc?   yes      Grab bars in the bathroom? yes      Handrails on the stairs?   yes      Adequate lighting?   yes  Timed Get Up and Go performed: completed and within normal timeframe; no gait abnormalities noted   Depression Screen PHQ 2/9 Scores 09/10/2019 06/29/2018 01/24/2018 12/12/2017  PHQ - 2 Score 5 6 2 5   PHQ- 9 Score 16 27 5  -      Cognitive Function- no cognitive concerns at this time  Cognitive Testing  Alert? Yes         Normal Appearance? Yes  Oriented to person? Yes           Place? Yes  Time? Yes  Recall of three objects? Yes  Can perform simple calculations? Yes  Displays appropriate judgment? Yes  Can read the correct time from a watch face? Yes   Immunization History  Administered Date(s) Administered  . Fluad Quad(high Dose 65+) 07/16/2019  . Pneumococcal Conjugate-13  03/30/2018  . Pneumococcal Polysaccharide-23 07/16/2019  . Zoster Recombinat (Shingrix) 03/30/2018    Qualifies for Shingles Vaccine? Patient has completed #1 in Shingrix series, plans to complete series at local pharmacy   Screening Tests Health Maintenance  Topic Date Due  . MAMMOGRAM  03/10/2019  . DEXA SCAN  03/09/2020  . INFLUENZA VACCINE  Completed  . PNA vac Low Risk Adult  Completed    Cancer Screenings: Lung: Low Dose CT Chest recommended if Age 8-80 years, 30 pack-year currently smoking OR have quit w/in 15years. Patient does not qualify. Breast:  Up to date on Mammogram? Yes   Up to date of Bone Density/Dexa? Yes Colorectal: Cologuard normal 02/11/18     Plan:  I have personally reviewed and addressed the Medicare Annual Wellness questionnaire and have noted the following in the patient's chart:  A. Medical and social history B. Use of alcohol, tobacco or illicit drugs  C. Current medications and supplements D. Functional ability and status E.  Nutritional status F.  Physical activity G. Advance directives H. List of other physicians I.  Hospitalizations, surgeries, and ER visits in previous 12 months J.  [place] such as hearing and vision if needed, cognitive and depression L. Referrals, records requested, and appointments- none   In addition, I have reviewed and discussed with patient certain preventive protocols, quality metrics, and best practice recommendations. A written personalized  care plan for preventive services as well as general preventive health recommendations were provided to patient.   Signed,  Denman George, LPN  Nurse Health Advisor   Nurse Notes: no additional

## 2019-09-10 NOTE — Patient Instructions (Signed)
Veronica Myers , Thank you for taking time to come for your Medicare Wellness Visit. I appreciate your ongoing commitment to your health goals. Please review the following plan we discussed and let me know if I can assist you in the future.   Screening recommendations/referrals: Colorectal Screening: up to date; Cologuard normal 02/11/18 Mammogram: up to date; last 03/09/18 Bone Density: up to date; last 03/09/18  Vision and Dental Exams: Recommended annual ophthalmology exams for early detection of glaucoma and other disorders of the eye Recommended annual dental exams for proper oral hygiene  Vaccinations: Influenza vaccine: completed 07/16/19 Pneumococcal vaccine: up to date 07/16/19 Tdap vaccine: recommended; Please call your insurance company to determine your out of pocket expense. You may receive this vaccine at your local pharmacy or Health Dept. Shingles vaccine: Please completed the 2nd part of the vaccine   Advanced directives: Please bring a copy of your POA (Power of [place]) and/or Living Will to your next appointment.  Goals: Recommend to drink at least 6-8 8oz glasses of water per day and consume a balanced diet rich in fresh fruits and vegetables.   Next appointment: Please schedule your Annual Wellness Visit with your Nurse Health Advisor in one year.  Preventive Care 8 Years and Older, Female Preventive care refers to lifestyle choices and visits with your health care provider that can promote health and wellness. What does preventive care include?  A yearly physical exam. This is also called an annual well check.  Dental exams once or twice a year.  Routine eye exams. Ask your health care provider how often you should have your eyes checked.  Personal lifestyle choices, including:  Daily care of your teeth and gums.  Regular physical activity.  Eating a healthy diet.  Avoiding tobacco and drug use.  Limiting alcohol use.  Practicing safe sex.  Taking  low-dose aspirin every day if recommended by your health care provider.  Taking vitamin and mineral supplements as recommended by your health care provider. What happens during an annual well check? The services and screenings done by your health care provider during your annual well check will depend on your age, overall health, lifestyle risk factors, and family history of disease. Counseling  Your health care provider may ask you questions about your:  Alcohol use.  Tobacco use.  Drug use.  Emotional well-being.  Home and relationship well-being.  Sexual activity.  Eating habits.  History of falls.  Memory and ability to understand (cognition).  Work and work Astronomer.  Reproductive health. Screening  You may have the following tests or measurements:  Height, weight, and BMI.  Blood pressure.  Lipid and cholesterol levels. These may be checked every 5 years, or more frequently if you are over 2 years old.  Skin check.  Lung cancer screening. You may have this screening every year starting at age 96 if you have a 30-pack-year history of smoking and currently smoke or have quit within the past 15 years.  Fecal occult blood test (FOBT) of the stool. You may have this test every year starting at age 66.  Flexible sigmoidoscopy or colonoscopy. You may have a sigmoidoscopy every 5 years or a colonoscopy every 10 years starting at age 18.  Hepatitis C blood test.  Hepatitis B blood test.  Sexually transmitted disease (STD) testing.  Diabetes screening. This is done by checking your blood sugar (glucose) after you have not eaten for a while (fasting). You may have this done every 1-3 years.  Bone  density scan. This is done to screen for osteoporosis. You may have this done starting at age 58.  Mammogram. This may be done every 1-2 years. Talk to your health care provider about how often you should have regular mammograms. Talk with your health care provider  about your test results, treatment options, and if necessary, the need for more tests. Vaccines  Your health care provider may recommend certain vaccines, such as:  Influenza vaccine. This is recommended every year.  Tetanus, diphtheria, and acellular pertussis (Tdap, Td) vaccine. You may need a Td booster every 10 years.  Zoster vaccine. You may need this after age 74.  Pneumococcal 13-valent conjugate (PCV13) vaccine. One dose is recommended after age 19.  Pneumococcal polysaccharide (PPSV23) vaccine. One dose is recommended after age 42. Talk to your health care provider about which screenings and vaccines you need and how often you need them. This information is not intended to replace advice given to you by your health care provider. Make sure you discuss any questions you have with your health care provider. Document Released: 09/12/2015 Document Revised: 05/05/2016 Document Reviewed: 06/17/2015 Elsevier Interactive Patient Education  2017 [place] Prevention in the Home Falls can cause injuries. They can happen to people of all ages. There are many things you can do to make your home safe and to help prevent falls. What can I do on the outside of my home?  Regularly fix the edges of walkways and driveways and fix any cracks.  Remove anything that might make you trip as you walk through a door, such as a raised step or threshold.  Trim any bushes or trees on the path to your home.  Use bright outdoor lighting.  Clear any walking paths of anything that might make someone trip, such as rocks or tools.  Regularly check to see if handrails are loose or broken. Make sure that both sides of any steps have handrails.  Any raised decks and porches should have guardrails on the edges.  Have any leaves, snow, or ice cleared regularly.  Use sand or salt on walking paths during winter.  Clean up any spills in your garage right away. This includes oil or grease  spills. What can I do in the bathroom?  Use night lights.  Install grab bars by the toilet and in the tub and shower. Do not use towel bars as grab bars.  Use non-skid mats or decals in the tub or shower.  If you need to sit down in the shower, use a plastic, non-slip stool.  Keep the floor dry. Clean up any water that spills on the floor as soon as it happens.  Remove soap buildup in the tub or shower regularly.  Attach bath mats securely with double-sided non-slip rug tape.  Do not have throw rugs and other things on the floor that can make you trip. What can I do in the bedroom?  Use night lights.  Make sure that you have a light by your bed that is easy to reach.  Do not use any sheets or blankets that are too big for your bed. They should not hang down onto the floor.  Have a firm chair that has side arms. You can use this for support while you get dressed.  Do not have throw rugs and other things on the floor that can make you trip. What can I do in the kitchen?  Clean up any spills right away.  Avoid walking on  wet floors.  Keep items that you use a lot in easy-to-reach places.  If you need to reach something above you, use a strong step stool that has a grab bar.  Keep electrical cords out of the way.  Do not use floor polish or wax that makes floors slippery. If you must use wax, use non-skid floor wax.  Do not have throw rugs and other things on the floor that can make you trip. What can I do with my stairs?  Do not leave any items on the stairs.  Make sure that there are handrails on both sides of the stairs and use them. Fix handrails that are broken or loose. Make sure that handrails are as long as the stairways.  Check any carpeting to make sure that it is firmly attached to the stairs. Fix any carpet that is loose or worn.  Avoid having throw rugs at the top or bottom of the stairs. If you do have throw rugs, attach them to the floor with carpet  tape.  Make sure that you have a light switch at the top of the stairs and the bottom of the stairs. If you do not have them, ask someone to add them for you. What else can I do to help prevent falls?  Wear shoes that:  Do not have high heels.  Have rubber bottoms.  Are comfortable and fit you well.  Are closed at the toe. Do not wear sandals.  If you use a stepladder:  Make sure that it is fully opened. Do not climb a closed stepladder.  Make sure that both sides of the stepladder are locked into place.  Ask someone to hold it for you, if possible.  Clearly mark and make sure that you can see:  Any grab bars or handrails.  First and last steps.  Where the edge of each step is.  Use tools that help you move around (mobility aids) if they are needed. These include:  Canes.  Walkers.  Scooters.  Crutches.  Turn on the lights when you go into a dark area. Replace any light bulbs as soon as they burn out.  Set up your furniture so you have a clear path. Avoid moving your furniture around.  If any of your floors are uneven, fix them.  If there are any pets around you, be aware of where they are.  Review your medicines with your doctor. Some medicines can make you feel dizzy. This can increase your chance of falling. Ask your doctor what other things that you can do to help prevent falls. This information is not intended to replace advice given to you by your health care provider. Make sure you discuss any questions you have with your health care provider. Document Released: 06/12/2009 Document Revised: 01/22/2016 Document Reviewed: 09/20/2014 Elsevier Interactive Patient Education  2017 [employer].

## 2019-09-10 NOTE — Progress Notes (Signed)
Subjective  Chief Complaint  Patient presents with  . Annual Exam    HPI: Veronica Myers is a 79 y.o. female who presents to [facility] [place] today for a Female Wellness Visit. She also has the concerns and/or needs as listed above in the chief complaint. These will be addressed in addition to the Health Maintenance Visit.   Wellness Visit: annual visit with health maintenance review and exam without Pap, had AWV today   HM: due mammo but pt deferring until after covid surge.nonfasting today. Not certain about getting the new covid vaccination. Now living in [place] again and planning to stay awhile.  Chronic disease f/u and/or acute problem visit: (deemed necessary to be done in addition to the wellness visit):  Chronic depression: again active. Stopped cymbalta months ago because "it wasn't working". Feels stress of life circumstances: interferes with mood, sleep and happiness. Would like to try another medication. No SI. Supportive family  GERD: stopped prilosec: "was no longer working". Describes bloating. I reviewed her notes regarding GI complaints from the last 2 years. Had neg celiac workup and PPI with sxs resolving then returning. Tend to go along with her mood/anxiety. She did see a GI in [place] last year; work up was interrupted by covid. She is not interested in seeing GI again at this time. No blood in stool, weight changes, change in appetite or localized abdominal pain. At times, c/o "acid".   HTN has been controlled on bb. No cp sob or LE edema  Vitiligo: blames much of her stress on this disorder. She feels badly about it.   Osteoporosis w/ recent compression fraction, thoracic; improved w/ PT. Still with some low back pain. Stopped boniva: ? H/o rash and with GERd contraindicated. Was supposed to start prolia last year but moved to [place]. Now would be willing to start.  Depression screen [facility] 2/9 09/10/2019 06/29/2018 01/24/2018 12/12/2017  Decreased  Interest 2 3 2 3   Down, Depressed, Hopeless 3 3 0 2  PHQ - 2 Score 5 6 2 5   Altered sleeping 3 3 0 -  Tired, decreased energy 3 3 1 3   Change in appetite 2 3 0 2  Feeling bad or failure about yourself  1 3 1 3   Trouble concentrating 1 3 0 3  Moving slowly or fidgety/restless 1 3 0 -  Suicidal thoughts 0 3 1 3   PHQ-9 Score 16 27 5  -  Difficult doing work/chores Very difficult Extremely dIfficult Not difficult at all Very difficult    Assessment  1. Annual physical exam   2. Chronic prescription benzodiazepine use   3. Essential hypertension   4. Major depression, recurrent, chronic (HCC)   5. Age-related osteoporosis with current pathological fracture with routine healing, subsequent encounter   6. Gastroesophageal reflux disease, unspecified whether esophagitis present   7. Vitiligo   8. Thoracogenic scoliosis of thoracic region      Plan  Female Wellness Visit:  Age appropriate Health Maintenance and Prevention measures were discussed with patient. Included topics are cancer screening recommendations, ways to keep healthy (see AVS) including dietary and exercise recommendations, regular eye and dental care, use of seat belts, and avoidance of moderate alcohol use and tobacco use. Needs mammogram. Nl breast exam today  BMI: discussed patient's BMI and encouraged positive lifestyle modifications to help get to or maintain a target BMI.  HM needs and immunizations were addressed and ordered. See below for orders. See HM and immunization section for updates.  Routine labs and screening tests ordered including cmp, cbc and lipids where appropriate.  Discussed recommendations regarding Vit D and calcium supplementation (see AVS)  Chronic disease management visit and/or acute problem visit:  HTN is controlled. Check renal function and electrolytes.  Check lipid  Osteoporosis with fracture. rec prolia. Will try to get set up for injections. Pt declined osteoporosis clinic  referral.   Depression: active. Trial of prozac and close f/u.   GERD: trial of protonix. Can refer back to gi when she is interested for EGD.   Back pain: otc meds and exercises at this point.   Follow up: Return in about 6 weeks (around 10/22/2019) for mood follow up.  Orders Placed This Encounter  Procedures  . CMP  . TSH  . Lipids  . Vit D 25OH   Meds ordered this encounter  Medications  . FLUoxetine (PROZAC) 20 MG tablet    Sig: Take 0.5 tablets (10 mg total) by mouth daily for 7 days, THEN 1 tablet (20 mg total) daily for 23 days.    Dispense:  30 tablet    Refill:  2  . pantoprazole (PROTONIX) 20 MG tablet    Sig: Take 1 tablet (20 mg total) by mouth daily.    Dispense:  30 tablet    Refill:  5      Lifestyle: Body mass index is 27.33 kg/m. Wt Readings from Last 3 Encounters:  09/10/19 149 lb 6.4 oz (67.8 kg)  09/10/19 149 lb 6.4 oz (67.8 kg)  08/07/19 143 lb 6.4 oz (65 kg)     Patient Active Problem List   Diagnosis Date Noted  . Compression fracture of thoracic vertebra with routine healing 08/07/2019  . Thoracogenic scoliosis of thoracic region 07/16/2019  . Vitiligo 03/31/2018  . Major depression, recurrent, chronic (HCC) 12/12/2017    On cymbalta and wellbutrin in past.(from record review)    . Essential hypertension 12/12/2017  . GERD (gastroesophageal reflux disease) 12/12/2017  . History of diverticulitis 12/12/2017  . Osteoporosis 12/12/2017    Dexa 02/2018 t + -4.1 lowest spine; rec ov to discuss therapies. From old medical records. No dexa report attached. Had been on fosamax.     Health Maintenance  Topic Date Due  . MAMMOGRAM  03/10/2019  . DEXA SCAN  03/09/2020  . INFLUENZA VACCINE  Completed  . PNA vac Low Risk Adult  Completed   Immunization History  Administered Date(s) Administered  . Fluad Quad(high Dose 65+) 07/16/2019  . Pneumococcal Conjugate-13 03/30/2018  . Pneumococcal Polysaccharide-23 07/16/2019  . Zoster Recombinat  (Shingrix) 03/30/2018   We updated and reviewed the patient's past history in detail and it is documented below. Allergies: Patient has No Known Allergies. 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 Past Surgical History Patient  has no past surgical history on file. Family History: Patient family history includes Arthritis in her father and mother; Depression in her father and mother; Diabetes in her father and mother; Heart disease in her father and mother; Hypertension in her father and mother. Social History:  Patient  reports that she has never smoked. She has never used smokeless tobacco. She reports previous alcohol use. She reports that she does not use drugs.  Review of Systems: Constitutional: negative for fever or malaise Ophthalmic: negative for photophobia, double vision or loss of vision Cardiovascular: negative for chest pain, dyspnea  on exertion, or new LE swelling Respiratory: negative for SOB or persistent cough Gastrointestinal: negative for abdominal pain, change in bowel habits or melena Genitourinary: negative for dysuria or gross hematuria, no abnormal uterine bleeding or disharge Musculoskeletal: negative for new gait disturbance or muscular weakness Integumentary: negative for new or persistent rashes, no breast lumps Neurological: negative for TIA or stroke symptoms Psychiatric: negative for SI or delusions Allergic/Immunologic: negative for hives  Patient Care Team    Relationship Specialty Notifications Start End  Willow Ora, MD PCP - General Family Medicine  12/12/17   Rodolph Bong, MD Consulting Physician Sports Medicine  09/10/19     Objective  Vitals: BP 126/64   Pulse 76   Temp (!) 97.2 F (36.2 C) (Temporal)   Ht 5\' 2"   (1.575 m)   Wt 149 lb 6.4 oz (67.8 kg)   BMI 27.33 kg/m  General:  Well developed, well nourished, no acute distress  Psych:  Alert and orientedx3,normal mood and affect HEENT:  Normocephalic, atraumatic, non-icteric sclera, PERRL, oropharynx is clear without mass or exudate, supple neck without adenopathy, mass or thyromegaly Cardiovascular:  Normal S1, S2, RRR without gallop, rub or murmur, nondisplaced PMI Respiratory:  Good breath sounds bilaterally, CTAB with normal respiratory effort Gastrointestinal: normal bowel sounds, soft, non-tender, no noted masses. No HSM MSK: no deformities, contusions. Joints are without erythema or swelling. Spine and CVA region are nontender Skin:  Warm, no rashes or suspicious lesions noted Neurologic:    Mental status is normal. CN 2-11 are normal. Gross motor and sensory exams are normal. Normal gait. No tremor Breast Exam: No mass, skin retraction or nipple discharge is appreciated in either breast. No axillary adenopathy. Fibrocystic changes are not noted   Commons side effects, risks, benefits, and alternatives for medications and treatment plan prescribed today were discussed, and the patient expressed understanding of the given instructions. Patient is instructed to call or message via MyChart if he/she has any questions or concerns regarding our treatment plan. No barriers to understanding were identified. We discussed Red Flag symptoms and signs in detail. Patient expressed understanding regarding what to do in case of urgent or emergency type symptoms.   Medication list was reconciled, printed and provided to the patient in AVS. Patient instructions and summary information was reviewed with the patient as documented in the AVS. This note was prepared with assistance of Dragon voice recognition software. Occasional wrong-word or sound-a-like substitutions may have occurred due to the inherent limitations of voice recognition software  This visit  occurred during the SARS-CoV-2 public health emergency.  Safety protocols were in place, including screening questions prior to the visit, additional usage of staff PPE, and extensive cleaning of exam room while observing appropriate contact time as indicated for disinfecting solutions.

## 2019-09-14 ENCOUNTER — Telehealth: Payer: Self-pay

## 2019-09-14 NOTE — Telephone Encounter (Signed)
We have discussed several times. Pt agreed at office visit. She may have changed her mind again. Will await her input.

## 2019-10-19 ENCOUNTER — Other Ambulatory Visit: Payer: Self-pay

## 2019-10-22 ENCOUNTER — Other Ambulatory Visit: Payer: Self-pay

## 2019-10-22 ENCOUNTER — Ambulatory Visit (INDEPENDENT_AMBULATORY_CARE_PROVIDER_SITE_OTHER): Payer: Medicare HMO | Admitting: Family Medicine

## 2019-10-22 ENCOUNTER — Encounter: Payer: Self-pay | Admitting: Family Medicine

## 2019-10-22 VITALS — BP 144/80 | HR 53 | Temp 97.9°F | Resp 14 | Wt 151.6 lb

## 2019-10-22 DIAGNOSIS — M8000XD Age-related osteoporosis with current pathological fracture, unspecified site, subsequent encounter for fracture with routine healing: Secondary | ICD-10-CM | POA: Diagnosis not present

## 2019-10-22 DIAGNOSIS — I10 Essential (primary) hypertension: Secondary | ICD-10-CM

## 2019-10-22 DIAGNOSIS — F339 Major depressive disorder, recurrent, unspecified: Secondary | ICD-10-CM | POA: Diagnosis not present

## 2019-10-22 DIAGNOSIS — K219 Gastro-esophageal reflux disease without esophagitis: Secondary | ICD-10-CM

## 2019-10-22 MED ORDER — DENOSUMAB 60 MG/ML ~~LOC~~ SOSY
60.0000 mg | PREFILLED_SYRINGE | Freq: Once | SUBCUTANEOUS | Status: AC
  Administered 2019-10-22: 15:00:00 60 mg via SUBCUTANEOUS

## 2019-10-22 MED ORDER — FLUOXETINE HCL 10 MG PO TABS: 10.0000 mg | ORAL_TABLET | Freq: Every day | ORAL | 3 refills | Status: DC

## 2019-10-22 NOTE — Progress Notes (Signed)
Veronica Myers 79 y.o. female presents to office today for routine follow up. Per Asencion Partridge, MD administered DENOSUMAB 60 mg/mL subcutaneous left arm. Patient tolerated well.

## 2019-10-22 NOTE — Progress Notes (Signed)
Subjective  CC:  Chief Complaint  Patient presents with  . Anxiety    states that she as been taking half tabs of PROZAC, does believe that medication is helping with mood  . Hypertension  . Osteoporosis  . Gastroesophageal Reflux    HPI: Veronica Myers is a 79 y.o. female who presents to the office today to address the problems listed above in the chief complaint, mood problems.  F/u depression: doing well on 10mg  daily prozac. Mood questionaire below is zero today. Felt too drowsy on 20mg  (took for one week). Less stressed and happier. No AEs   GERD: on probiotics which she feels is helping her sxs. Feels she has leak gut syndrome. Doing better.   HTN: takes bb at night. Reports home readings are consistently normal: 130/60s. Feeling well. Taking medications w/o adverse effects. No symptoms of CHF, angina; no palpitations, sob, cp or lower extremity edema. Compliant with meds.   Osteoporosis: again discussed indications for treatment and options of therapy. rec prolia due to chronic gerd sxs. Pt agrees to start today.  Depression screen [facility] 2/9 10/22/2019 09/10/2019 06/29/2018  Decreased Interest 0 2 3  Down, Depressed, Hopeless 0 3 3  PHQ - 2 Score 0 5 6  Altered sleeping 0 3 3  Tired, decreased energy 0 3 3  Change in appetite 0 2 3  Feeling bad or failure about yourself  0 1 3  Trouble concentrating 0 1 3  Moving slowly or fidgety/restless 0 1 3  Suicidal thoughts 0 0 3  PHQ-9 Score 0 16 27  Difficult doing work/chores - Very difficult Extremely dIfficult    Assessment  1. Major depression, recurrent, chronic (HCC)   2. Gastroesophageal reflux disease, unspecified whether esophagitis present   3. Age-related osteoporosis with current pathological fracture with routine healing, subsequent encounter   4. Essential hypertension      Plan   Depression:  Improved on low dose prozac. Recheck 3-6 months  Reviewed concept of mood problems caused by biochemical imbalance  of neurotransmitters and rationale for treatment with medications and therapy.   Counseling given: pt was instructed to contact office, on-call physician or crisis Hotline if symptoms worsen significantly. If patient develops any suicidal or homicidal thoughts, she is directed to the ER immediately.   Osteoporosis: start prolia today and schedule f/u in 76months. Due dexa in 02/2020, may consider postponing until 09/2020 after receiving prolia x 2.   GERD: improved.   HTN: on BB. Mildly elevated in office today. Will monitor. Home readings are reportedly normal.   Follow up: Return in about 6 months (around 04/20/2020) for mood follow up, follow up Hypertension.  No orders of the defined types were placed in this encounter.  Meds ordered this encounter  Medications  . FLUoxetine (PROZAC) 10 MG tablet    Sig: Take 1 tablet (10 mg total) by mouth daily.    Dispense:  90 tablet    Refill:  3  . denosumab (PROLIA) injection 60 mg      I reviewed the patients updated PMH, FH, and SocHx.    Patient Active Problem List   Diagnosis Date Noted  . Compression fracture of thoracic vertebra with routine healing 08/07/2019  . Thoracogenic scoliosis of thoracic region 07/16/2019  . Vitiligo 03/31/2018  . Major depression, recurrent, chronic (HCC) 12/12/2017  . Essential hypertension 12/12/2017  . GERD (gastroesophageal reflux disease) 12/12/2017  . History of diverticulitis 12/12/2017  . Osteoporosis 12/12/2017   Current Meds  Medication Sig  . CALCIUM CARB-MAGNESIUM CARB PO Take by mouth.  . Ginkgo Biloba 120 MG CAPS Take by mouth.  Marland Kitchen GLUCOSAMINE-CHONDROIT-MSM-C-MN PO Take by mouth.  . metoprolol succinate (TOPROL-XL) 50 MG 24 hr tablet Take 1 tablet (50 mg total) by mouth daily.  . Multiple Vitamins-Minerals (ZINC PO) Take by mouth.  . Omega-3 Krill Oil 500 MG CAPS Take by mouth.  . pantoprazole (PROTONIX) 20 MG tablet Take 1 tablet (20 mg total) by mouth daily.  . Probiotic Product  (ALIGN PO) Take by mouth.    Allergies: Patient has No Known Allergies. Family history:  Patient family history includes Arthritis in her father and mother; Depression in her father and mother; Diabetes in her father and mother; Heart disease in her father and mother; Hypertension in her father and mother. Social History   Socioeconomic History  . Marital status: Widowed    Spouse name: Not on file  . Number of children: 3  . Years of education: Not on file  . Highest education level: Not on file  Occupational History  . Not on file  Tobacco Use  . Smoking status: Never Smoker  . Smokeless tobacco: Never Used  Substance and Sexual Activity  . Alcohol use: Not Currently  . Drug use: Never  . Sexual activity: Not Currently  Other Topics Concern  . Not on file  Social History Narrative  . Not on file   Social Determinants of Health   Financial Resource Strain:   . Difficulty of Paying Living Expenses: Not on file  Food Insecurity:   . Worried About Charity fundraiser in the Last Year: Not on file  . Ran Out of Food in the Last Year: Not on file  Transportation Needs:   . Lack of Transportation (Medical): Not on file  . Lack of Transportation (Non-Medical): Not on file  Physical Activity:   . Days of Exercise per Week: Not on file  . Minutes of Exercise per Session: Not on file  Stress:   . Feeling of Stress : Not on file  Social Connections:   . Frequency of Communication with Friends and Family: Not on file  . Frequency of Social Gatherings with Friends and Family: Not on file  . Attends Religious Services: Not on file  . Active Member of Clubs or Organizations: Not on file  . Attends Archivist Meetings: Not on file  . Marital Status: Not on file     Review of Systems: Constitutional: Negative for fever malaise or anorexia Cardiovascular: negative for chest pain Respiratory: negative for SOB or persistent cough Gastrointestinal: negative for  abdominal pain  Objective  Vitals: BP (!) 144/80   Pulse (!) 53   Temp 97.9 F (36.6 C) (Temporal)   Resp 14   Wt 151 lb 9.6 oz (68.8 kg)   SpO2 95%   BMI 27.73 kg/m  General: no acute distress, well appearing, no apparent distress, well groomed Psych:  Alert and oriented x 3,. normal affect Cardiovascular:  RRR without murmur or gallop. no peripheral edema Respiratory:  Good breath sounds bilaterally, CTAB with normal respiratory effort Gastrointestinal: soft, flat abdomen, normal active bowel sounds, no palpable masses, no hepatosplenomegaly, no appreciated hernias Skin:  Warm, no rashes  prolia injection administered today. First.   Commons side effects, risks, benefits, and alternatives for medications and treatment plan prescribed today were discussed, and the patient expressed understanding of the given instructions. Patient is instructed to call or message via MyChart  if he/she has any questions or concerns regarding our treatment plan. No barriers to understanding were identified. We discussed Red Flag symptoms and signs in detail. Patient expressed understanding regarding what to do in case of urgent or emergency type symptoms.   Medication list was reconciled, printed and provided to the patient in AVS. Patient instructions and summary information was reviewed with the patient as documented in the AVS. This note was prepared with assistance of Dragon voice recognition software. Occasional wrong-word or sound-a-like substitutions may have occurred due to the inherent limitations of voice recognition software

## 2019-10-22 NOTE — Patient Instructions (Addendum)
Please return in 6 months for mood and blood pressure recheck.  Will give 2nd prolia injection at that visit as well.   If you have any questions or concerns, please don't hesitate to send me a message via MyChart or call the office at [phone]. Thank you for visiting with Veronica Myers today! It's our pleasure caring for you.  Continue taking your prozac.  I have ordered the smaller pill for you.   Continue checking your blood pressure at home. We want it to be < 140/90 consistently.    Hypertension, Adult High blood pressure (hypertension) is when the force of blood pumping through the arteries is too strong. The arteries are the blood vessels that carry blood from the heart throughout the body. Hypertension forces the heart to work harder to pump blood and may cause arteries to become narrow or stiff. Untreated or uncontrolled hypertension can cause a heart attack, heart failure, a stroke, kidney disease, and other problems. A blood pressure reading consists of a higher number over a lower number. Ideally, your blood pressure should be below 120/80. The first ("top") number is called the systolic pressure. It is a measure of the pressure in your arteries as your heart beats. The second ("bottom") number is called the diastolic pressure. It is a measure of the pressure in your arteries as the heart relaxes. What are the causes? The exact cause of this condition is not known. There are some conditions that result in or are related to high blood pressure. What increases the risk? Some risk factors for high blood pressure are under your control. The following factors may make you more likely to develop this condition:  Smoking.  Having type 2 diabetes mellitus, high cholesterol, or both.  Not getting enough exercise or physical activity.  Being overweight.  Having too much fat, sugar, calories, or salt (sodium) in your diet.  Drinking too much alcohol. Some risk factors for high blood pressure  may be difficult or impossible to change. Some of these factors include:  Having chronic kidney disease.  Having a family history of high blood pressure.  Age. Risk increases with age.  Race. You may be at higher risk if you are African American.  Gender. Men are at higher risk than women before age 39. After age 17, women are at higher risk than men.  Having obstructive sleep apnea.  Stress. What are the signs or symptoms? High blood pressure may not cause symptoms. Very high blood pressure (hypertensive crisis) may cause:  Headache.  Anxiety.  Shortness of breath.  Nosebleed.  Nausea and vomiting.  Vision changes.  Severe chest pain.  Seizures. How is this diagnosed? This condition is diagnosed by measuring your blood pressure while you are seated, with your arm resting on a flat surface, your legs uncrossed, and your feet flat on the floor. The cuff of the blood pressure monitor will be placed directly against the skin of your upper arm at the level of your heart. It should be measured at least twice using the same arm. Certain conditions can cause a difference in blood pressure between your right and left arms. Certain factors can cause blood pressure readings to be lower or higher than normal for a short period of time:  When your blood pressure is higher when you are in a health care provider's office than when you are at home, this is called white coat hypertension. Most people with this condition do not need medicines.  When your blood pressure  is higher at home than when you are in a health care provider's office, this is called masked hypertension. Most people with this condition may need medicines to control blood pressure. If you have a high blood pressure reading during one visit or you have normal blood pressure with other risk factors, you may be asked to:  Return on a different day to have your blood pressure checked again.  Monitor your blood pressure at  home for 1 week or longer. If you are diagnosed with hypertension, you may have other blood or imaging tests to help your health care provider understand your overall risk for other conditions. How is this treated? This condition is treated by making healthy lifestyle changes, such as eating healthy foods, exercising more, and reducing your alcohol intake. Your health care provider may prescribe medicine if lifestyle changes are not enough to get your blood pressure under control, and if:  Your systolic blood pressure is above 130.  Your diastolic blood pressure is above 80. Your personal target blood pressure may vary depending on your medical conditions, your age, and other factors. Follow these instructions at home: Eating and drinking   Eat a diet that is high in fiber and potassium, and low in sodium, added sugar, and fat. An example eating plan is called the DASH (Dietary Approaches to Stop Hypertension) diet. To eat this way: ? Eat plenty of fresh fruits and vegetables. Try to fill one half of your plate at each meal with fruits and vegetables. ? Eat whole grains, such as whole-wheat pasta, brown rice, or whole-grain bread. Fill about one fourth of your plate with whole grains. ? Eat or drink low-fat dairy products, such as skim milk or low-fat yogurt. ? Avoid fatty cuts of meat, processed or cured meats, and poultry with skin. Fill about one fourth of your plate with lean proteins, such as fish, chicken without skin, beans, eggs, or tofu. ? Avoid pre-made and processed foods. These tend to be higher in sodium, added sugar, and fat.  Reduce your daily sodium intake. Most people with hypertension should eat less than 1,500 mg of sodium a day.  Do not drink alcohol if: ? Your health care provider tells you not to drink. ? You are pregnant, may be pregnant, or are planning to become pregnant.  If you drink alcohol: ? Limit how much you use to:  0-1 drink a day for women.  0-2  drinks a day for men. ? Be aware of how much alcohol is in your drink. In the [place] one drink equals one 12 oz bottle of beer (355 mL), one 5 oz glass of wine (148 mL), or one 1 oz glass of hard liquor (44 mL). Lifestyle   Work with your health care provider to maintain a healthy body weight or to lose weight. Ask what an ideal weight is for you.  Get at least 30 minutes of exercise most days of the week. Activities may include walking, swimming, or biking.  Include exercise to strengthen your muscles (resistance exercise), such as Pilates or lifting weights, as part of your weekly exercise routine. Try to do these types of exercises for 30 minutes at least 3 days a week.  Do not use any products that contain nicotine or tobacco, such as cigarettes, e-cigarettes, and chewing tobacco. If you need help quitting, ask your health care provider.  Monitor your blood pressure at home as told by your health care provider.  Keep all follow-up visits as told  by your health care provider. This is important. Medicines  Take over-the-counter and prescription medicines only as told by your health care provider. Follow directions carefully. Blood pressure medicines must be taken as prescribed.  Do not skip doses of blood pressure medicine. Doing this puts you at risk for problems and can make the medicine less effective.  Ask your health care provider about side effects or reactions to medicines that you should watch for. Contact a health care provider if you:  Think you are having a reaction to a medicine you are taking.  Have headaches that keep coming back (recurring).  Feel dizzy.  Have swelling in your ankles.  Have trouble with your vision. Get help right away if you:  Develop a severe headache or confusion.  Have unusual weakness or numbness.  Feel faint.  Have severe pain in your chest or abdomen.  Vomit repeatedly.  Have trouble breathing. Summary  Hypertension is when the  force of blood pumping through your arteries is too strong. If this condition is not controlled, it may put you at risk for serious complications.  Your personal target blood pressure may vary depending on your medical conditions, your age, and other factors. For most people, a normal blood pressure is less than 120/80.  Hypertension is treated with lifestyle changes, medicines, or a combination of both. Lifestyle changes include losing weight, eating a healthy, low-sodium diet, exercising more, and limiting alcohol. This information is not intended to replace advice given to you by your health care provider. Make sure you discuss any questions you have with your health care provider. Document Revised: 04/26/2018 Document Reviewed: 04/26/2018 Elsevier Patient Education  2020 [employer].

## 2019-11-16 ENCOUNTER — Ambulatory Visit (INDEPENDENT_AMBULATORY_CARE_PROVIDER_SITE_OTHER): Payer: Medicare HMO | Admitting: Family Medicine

## 2019-11-16 ENCOUNTER — Other Ambulatory Visit: Payer: Self-pay

## 2019-11-16 ENCOUNTER — Encounter: Payer: Self-pay | Admitting: Family Medicine

## 2019-11-16 VITALS — BP 140/76 | HR 65 | Temp 97.2°F | Ht 62.0 in | Wt 151.8 lb

## 2019-11-16 DIAGNOSIS — K219 Gastro-esophageal reflux disease without esophagitis: Secondary | ICD-10-CM | POA: Diagnosis not present

## 2019-11-16 DIAGNOSIS — M7061 Trochanteric bursitis, right hip: Secondary | ICD-10-CM

## 2019-11-16 DIAGNOSIS — I10 Essential (primary) hypertension: Secondary | ICD-10-CM

## 2019-11-16 DIAGNOSIS — F339 Major depressive disorder, recurrent, unspecified: Secondary | ICD-10-CM

## 2019-11-16 DIAGNOSIS — M47816 Spondylosis without myelopathy or radiculopathy, lumbar region: Secondary | ICD-10-CM | POA: Diagnosis not present

## 2019-11-16 MED ORDER — TRIAMCINOLONE ACETONIDE 40 MG/ML IJ SUSP
40.0000 mg | Freq: Once | INTRAMUSCULAR | Status: AC
  Administered 2019-11-16: 40 mg via INTRAMUSCULAR

## 2019-11-16 NOTE — Patient Instructions (Signed)
Please return in 6 months for hypertension follow up. Be sure you have your next prolia injection appointment set up for June or July as well.   If you have any questions or concerns, please don't hesitate to send me a message via MyChart or call the office at [phone]. Thank you for visiting with [place] today! It's our pleasure caring for you.  You had a steroid injection today.   Things to be aware of after this injection are listed below:  You may experience no significant improvement or even a slight worsening in your symptoms during the first 24 to 48 hours.  After that we expect your symptoms to improve gradually over the next 2 weeks for the medicine to have its maximal effect.  You should continue to have improvement out to 6 weeks after your injection.  I recommend icing the site of the injection for 20 minutes  1-2 times the day of your injection  You may shower but no swimming, tub bath or Jacuzzi for 24 hours.  If your bandage falls off this does not need to be replaced.  It is appropriate to remove the bandage after 4 hours.  You may resume light activities as tolerated.     POSSIBLE PROCEDURE SIDE EFFECTS: The side effects of the injection are usually fairly minimal however if you may experience some of the following side effects that are usually self-limited and will is off on their own.  If you are concerned please feel free to call the office with questions:             Increased numbness or tingling             Nausea or vomiting             Swelling or bruising at the injection site    Please call our office if if you experience any of the following symptoms over the next week as these can be signs of infection:              Fever greater than 100.58F             Significant swelling at the injection site             Significant redness or drainage from the injection site

## 2019-11-16 NOTE — Progress Notes (Signed)
Subjective  CC:  Chief Complaint  Patient presents with  . Gastroesophageal Reflux    reflux and stomachs worse with age and medication (metoprolol)  . Panic Attack    high bp and panic attacks after taking fluoxetine    HPI: Veronica Myers is a 79 y.o. female who presents to the office today to address the problems listed above in the chief complaint.  Depression: on prozac but feels it elevates her bp. Has had readings as high as 180/87; took 2 bp pills daily for last4 days and wants to know if she should keep doing this. No cp or sob. Has failed prozac, lexapro and cymbalta due to side effects  Htn: had been low on 100 toprol xl in past.  Back pain and hip pain: has djd and h/o thoracic and lumbar compression fracture. Having more frequent pain w/o weakness or radiation. Has hip pain on right. Inquires about injections. Reviewed most recent xrays and mri  Gerd: chronic problem. No change   Assessment  1. Essential hypertension   2. Spondylosis of lumbar region without myelopathy or radiculopathy   3. Gastroesophageal reflux disease, unspecified whether esophagitis present   4. Major depression, recurrent, chronic (HCC)   5. Greater trochanteric bursitis of right hip      Plan   depression:  Would favor avoiding more mood meds; rec behavioral strategies or therapy  gerd on PPI; to GI when and if she is ready. Has seen one in [place] in past. Declines now  HTN: rec continuing current dose and monitoring while off prozac. Will increase meds if becomes elevated  Back pain and bursitis: s/p steroid injection. Prn tylenol. Defers SM eval at this time.   Osteoporosis: restarted prolia this year. Will defer f/u dexa for one year.   HM : pt defers mammogram.   Follow up: No follow-ups on file.  Visit date not found  No orders of the defined types were placed in this encounter.  No orders of the defined types were placed in this encounter.     I reviewed the patients  updated PMH, FH, and SocHx.    Patient Active Problem List   Diagnosis Date Noted  . 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 Compression fracture of thoracic vertebra with routine healing 08/07/2019  . Thoracogenic scoliosis of thoracic region 07/16/2019  . Vitiligo 03/31/2018  . Major depression, recurrent, chronic (HCC) 12/12/2017  . Essential hypertension 12/12/2017  . GERD (gastroesophageal reflux disease) 12/12/2017  . History of diverticulitis 12/12/2017  . Osteoporosis 12/12/2017   Current Meds  Medication Sig  . CALCIUM CARB-MAGNESIUM CARB PO Take by mouth.  Marland Kitchen GLUCOSAMINE-CHONDROIT-MSM-C-MN PO Take by mouth.  . metoprolol succinate (TOPROL-XL) 50 MG 24 hr tablet Take 1 tablet (50 mg total) by mouth daily.  . Multiple Vitamins-Minerals (MULTIVITAMIN ADULTS PO) Take by mouth.  . Omega-3 Krill Oil 500 MG CAPS Take by mouth.  . Probiotic Product (ALIGN PO) Take by mouth.    Allergies: Patient has No Known Allergies. Family History: Patient family history includes Arthritis in her father and mother; Depression in her father and mother; Diabetes in her father and mother; Heart disease in her father and mother; Hypertension in her father and mother. Social History:  Patient  reports that she has never smoked. She has never used smokeless tobacco. She reports previous alcohol use. She reports that she does not use drugs.  Review of Systems: Constitutional: Negative for fever malaise or anorexia Cardiovascular: negative  for chest pain Respiratory: negative for SOB or persistent cough Gastrointestinal: negative for abdominal pain  Objective  Vitals: BP 140/76 (BP Location: Left Arm, Patient Position: Sitting, Cuff Size: Normal)   Pulse 65   Temp (!) 97.2 F (36.2 C) (Temporal)   Ht 5\' 2"  (1.575 m)   Wt 151 lb 12.8 oz (68.9 kg)   SpO2 97%   BMI 27.76 kg/m  General: no acute distress , A&Ox3 HEENT: PEERL, conjunctiva normal, neck is  supple Cardiovascular:  RRR without murmur or gallop.  Respiratory:  Good breath sounds bilaterally, CTAB with normal respiratory effort Skin:  Warm, no rashes Right hip:ttp over bursa,  Normal gait  GR Trochanteric Bursa steroid injection  Procedure Note   Pre-operative Diagnosis: right hip bursitis   Post-operative Diagnosis: same   Indications: pain   Anesthesia: cold spray   Procedure Details    Verbal consent was obtained for the procedure. Universal time out done. The point of maximum tenderness was identified and marked over the hip bursa. The skin prepped with alcohol and cold spray used for anesthesia. A needle was advanced into the bursa and the steroid/lido was administered easily.    Complications:  None; patient tolerated the procedure well.      Commons side effects, risks, benefits, and alternatives for medications and treatment plan prescribed today were discussed, and the patient expressed understanding of the given instructions. Patient is instructed to call or message via MyChart if he/she has any questions or concerns regarding our treatment plan. No barriers to understanding were identified. We discussed Red Flag symptoms and signs in detail. Patient expressed understanding regarding what to do in case of urgent or emergency type symptoms.   Medication list was reconciled, printed and provided to the patient in AVS. Patient instructions and summary information was reviewed with the patient as documented in the AVS. This note was prepared with assistance of Dragon voice recognition software. Occasional wrong-word or sound-a-like substitutions may have occurred due to the inherent limitations of voice recognition software  This visit occurred during the SARS-CoV-2 public health emergency.  Safety protocols were in place, including screening questions prior to the visit, additional usage of staff PPE, and extensive cleaning of exam room while observing appropriate  contact time as indicated for disinfecting solutions.

## 2019-11-16 NOTE — Addendum Note (Signed)
Addended byGracy Racer on: 11/16/2019 04:01 PM   Modules accepted: Orders

## 2019-11-21 ENCOUNTER — Telehealth: Payer: Self-pay | Admitting: Family Medicine

## 2019-11-21 ENCOUNTER — Other Ambulatory Visit: Payer: Self-pay

## 2019-11-21 DIAGNOSIS — Z1239 Encounter for other screening for malignant neoplasm of breast: Secondary | ICD-10-CM

## 2019-11-21 NOTE — Telephone Encounter (Signed)
yes

## 2019-11-21 NOTE — Telephone Encounter (Signed)
Pt called requesting for Dr. Mardelle Matte to send an order for her to get a mammogram. Pt did not want to go get mammogram last time Dr. Mardelle Matte sent in order due to COVID. Please advise.

## 2019-11-21 NOTE — Telephone Encounter (Signed)
Mammogram order placed

## 2019-11-21 NOTE — Telephone Encounter (Signed)
Ok to place mammogram order? Please advise. thanks

## 2020-01-25 IMAGING — MR MR THORACIC SPINE W/O CM
4 of 6 series · 17 of 48 positions shown · non-contrast
Comparison: Radiographs of the thoracic spine 07/18/2019.

CLINICAL DATA: Compression fracture of body of thoracic vertebra.
Prevertebral plasty or kyphoplasty, thoracic spine.

EXAM:
MRI THORACIC SPINE WITHOUT CONTRAST
TECHNIQUE: Multiplanar, multisequence MR imaging of the thoracic spine was
performed. No intravenous contrast was administered.

[Series 4: T2 · sagittal · 4.0mm · 0.47mm/px · 5 of 17 slices shown (1 of 3)]
[im 1/17]
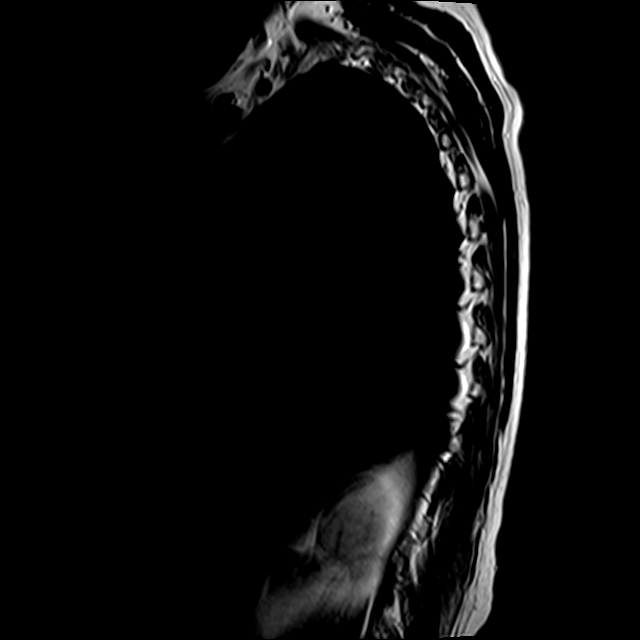
[im 5/17]
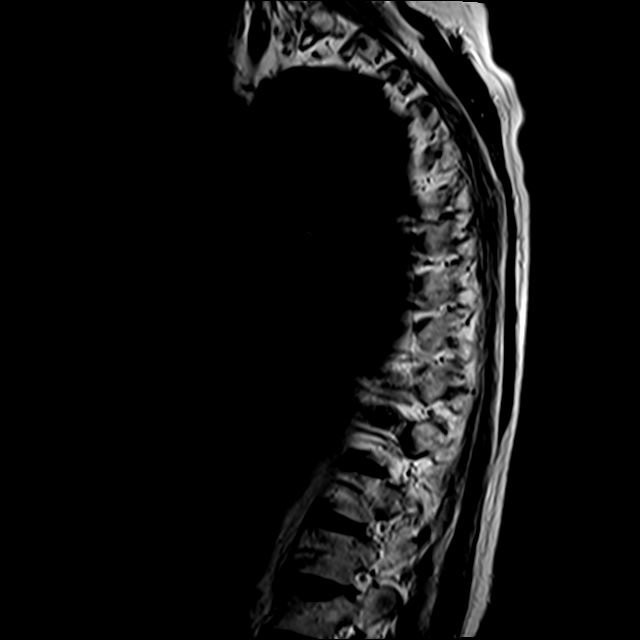
[im 9/17]
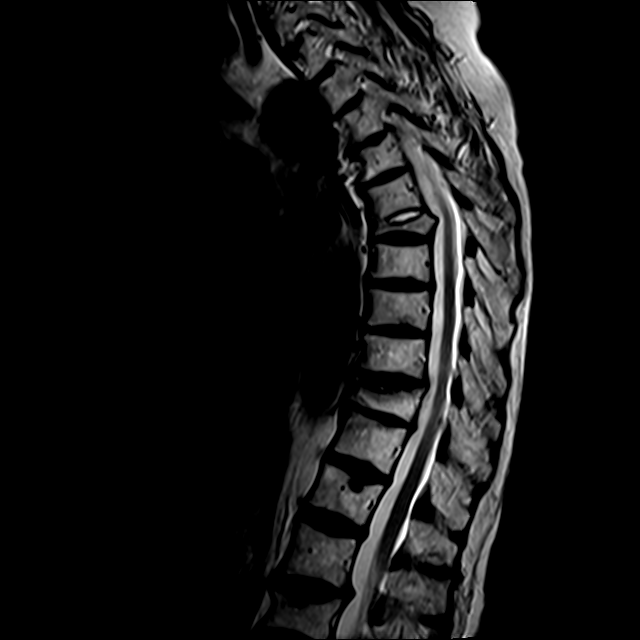
[im 13/17]
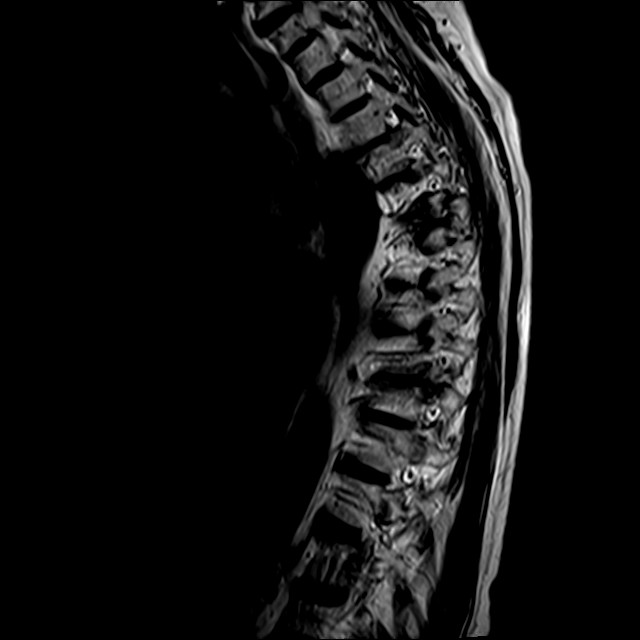
[im 17/17]
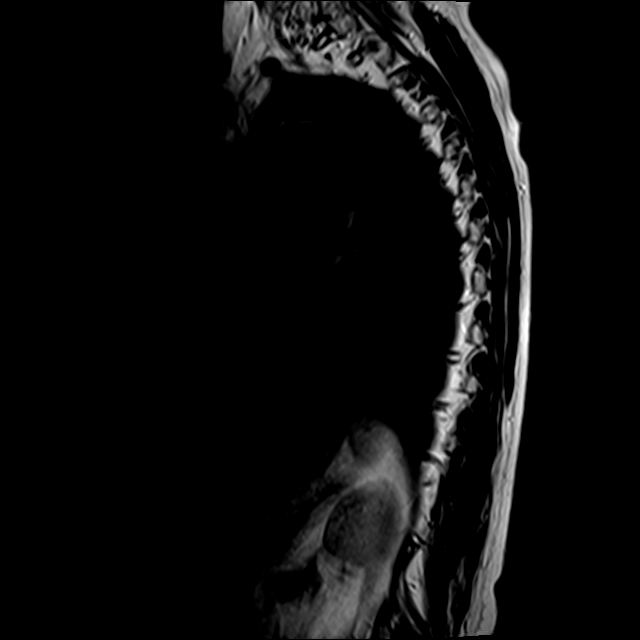

[Series 6: T1 · sagittal · 4.0mm · 0.94mm/px · 3 of 17 slices shown]
[im 4/17]
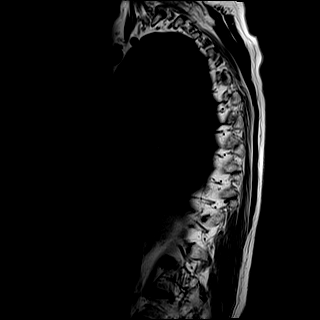
[im 10/17]
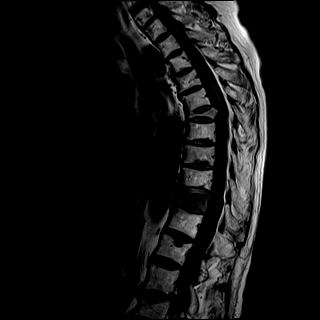
[im 17/17]
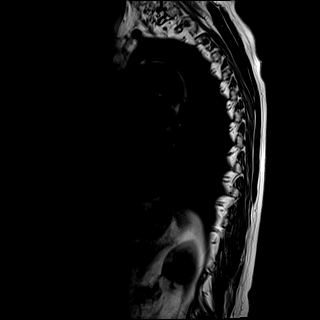

[Series 7: T2 · axial · 4.0mm · 0.39mm/px · z∈[-258,-91]mm · 6 of 38 slices shown (2 of 3)]
[im 1/38]
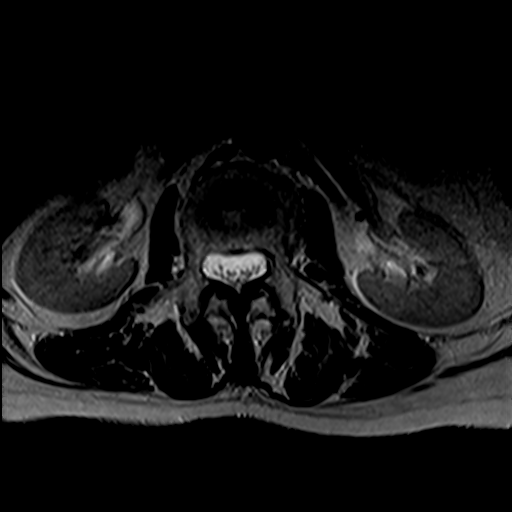
[im 7/38]
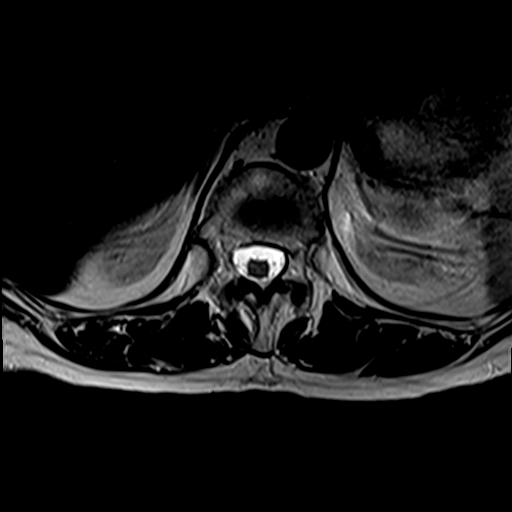
[im 13/38]
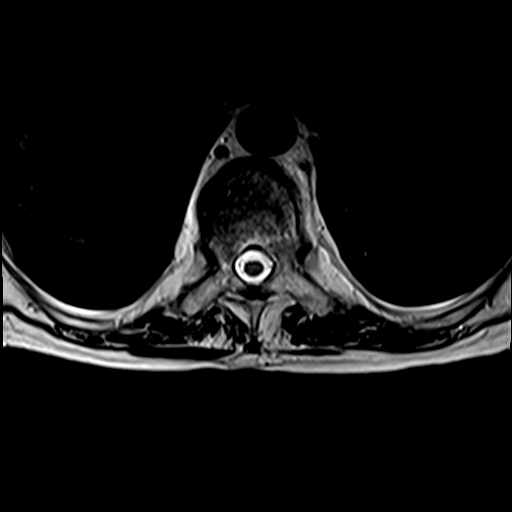
[im 16/38]
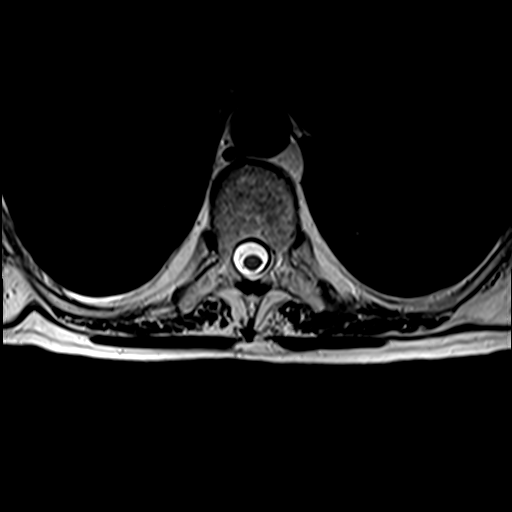
[im 19/38]
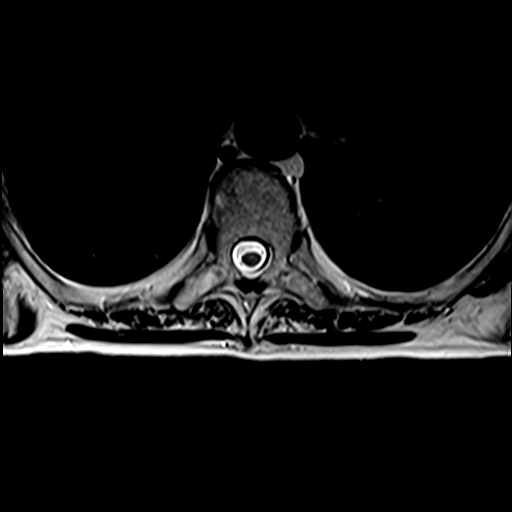
[im 31/38]
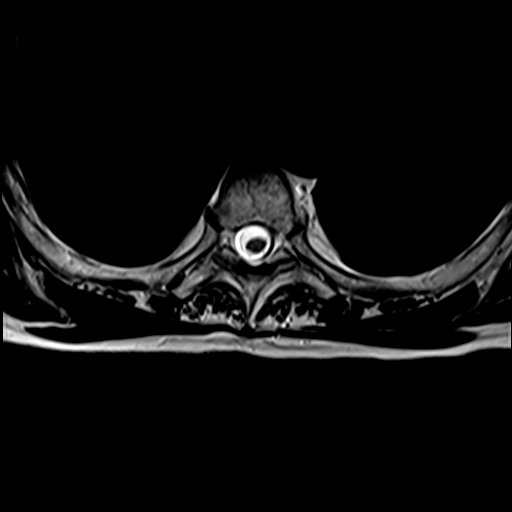

[Series 8: T2 · axial · 4.0mm · 0.39mm/px · z∈[-191,-91]mm · 3 of 38 slices shown (3 of 3)]
[im 7/38]
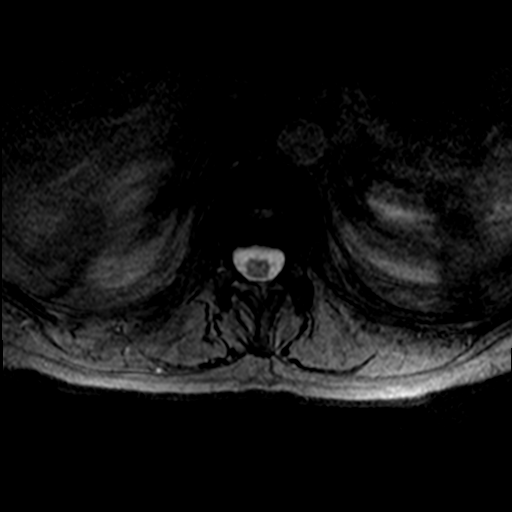
[im 19/38]
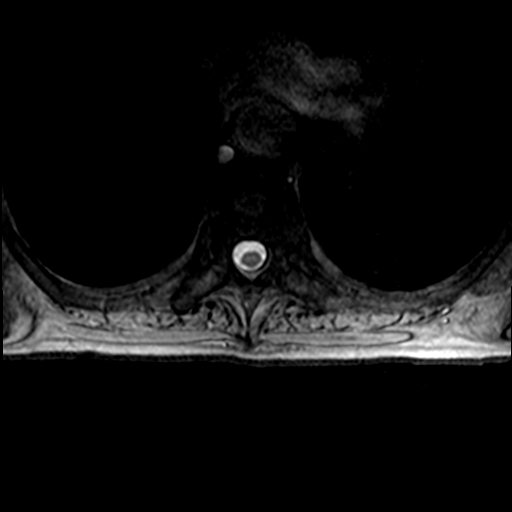
[im 31/38]
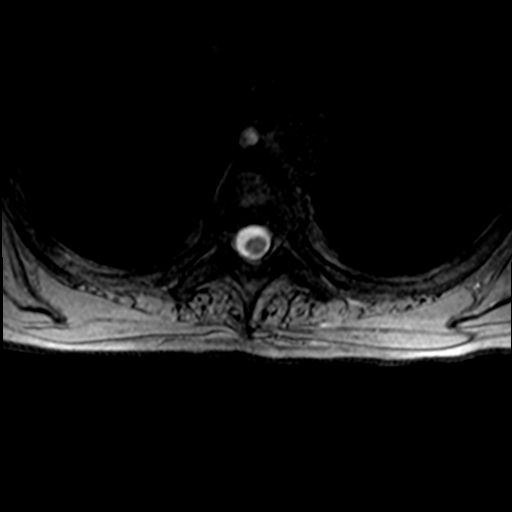

[17 of 48 positions shown; findings below may reference images not displayed]

FINDINGS: Incompletely assessed cervical spondylosis seen on scout localizer
imaging. C4-C5 grade 1 anterolisthesis.

Alignment: 6 mm bony retropulsion at the T6 level. Trace bony
retropulsion at the level of the T10 superior endplate. Trace bony
retropulsion also present at the level of the L2 superior endplate.
Exaggerated thoracic kyphosis centered at the T6 level.

Vertebrae: Redemonstrated severe T6 compression fracture (>80%
height loss). No associated marrow edema at this site, consistent
with chronic fracture. Redemonstrated T10 anterior wedge compression
fracture open (~40% height loss). There is edema signal throughout
the T10 vertebral body extending slightly into the pedicles,
consistent with subacute fracture. No progressive height loss since
thoracic spine radiographs 07/18/2019. Mild edema signal within the
T11 superior endplate is likely degenerative. Mild chronic T12
superior endplate compression deformity with superimposed Schmorl
node. Thoracic vertebral body height is otherwise maintained. Mild
chronic L2 anterior wedge compression fracture.

Cord:  No spinal cord signal abnormality.

Paraspinal and other soft tissues: No abnormality within included
portions of the thorax or visualized upper abdomen. Paraspinal soft
tissues within normal limits.

Disc levels:

Mild multilevel disc degeneration. Facet hypertrophy, greatest
within the lower thoracic spine. Small multilevel disc bulges. Small
T2-T3 central disc protrusion without significant canal stenosis.
Bony retropulsion at the T6 level mildly effaces the ventral thecal
sac, although without significant central canal stenosis. At T6-T7,
bony retropulsion and associated disc bulge contributes to mild
right and moderate/severe left neural foraminal narrowing. Small
T7-T8 central disc protrusion without significant canal stenosis.
Small T8-T9 left center disc protrusion without significant canal
stenosis. Minimal bony retropulsion at the level of the T10 superior
endplate without significant canal stenosis. Small T12-L1 central
disc protrusion without significant canal stenosis.

At L1-L2, minimal bony retropulsion at the level of the L2 superior
endplate with associated disc bulge and small left
subarticular/foraminal disc protrusion. The disc protrusion
contributes to mild left subarticular narrowing without significant
central canal stenosis.
IMPRESSION: Subacute T10 compression fracture (~40% height loss) and only
trace bony retropulsion. Persistent edema throughout the T10
vertebral body and extending into the bilateral pedicles. No
progressive height loss since radiographs 07/18/2019.

Chronic severe T6 compression fracture (>80% height loss). 6 mm bony
retropulsion at this level partially effaces the ventral thecal sac,
although without significant central canal stenosis. At T6-T7, bony
retropulsion and disc bulge contribute to bilateral neural foraminal
narrowing (mild right, moderate/severe left).

Mild chronic superior endplate compression fractures at T12 and L2.

Superimposed thoracic spondylosis as described. No significant
central canal stenosis at any level.

At L1-L2, mild bony retropulsion, disc bulge and left
subarticular/foraminal disc protrusion. Resultant mild left
subarticular narrowing without significant central canal or neural
foraminal stenosis.

## 2020-01-29 ENCOUNTER — Ambulatory Visit
Admission: RE | Admit: 2020-01-29 | Discharge: 2020-01-29 | Disposition: A | Discharge status: Home / Self Care | Payer: Medicare HMO | Source: Ambulatory Visit | Attending: Family Medicine | Admitting: Family Medicine

## 2020-01-29 ENCOUNTER — Other Ambulatory Visit: Payer: Self-pay

## 2020-01-29 DIAGNOSIS — Z1231 Encounter for screening mammogram for malignant neoplasm of breast: Secondary | ICD-10-CM | POA: Diagnosis not present

## 2020-01-29 DIAGNOSIS — Z1239 Encounter for other screening for malignant neoplasm of breast: Secondary | ICD-10-CM

## 2020-02-27 ENCOUNTER — Telehealth: Payer: Self-pay | Admitting: Family Medicine

## 2020-02-27 NOTE — Telephone Encounter (Signed)
Called patient she has had symptoms for over a year. She has sob with exercise and some increased fatigue. She denies any chest pain, palpitations, changes in vision or headache. She did not want to make an appointment for evaluation. She wanted to know if she can get referral to cardiology. If she can't she will wait until follow up in September to talk about it. I have reviewed red words with patient several times in the phone call and had repeat back to me. If she has any red words or increase in symptoms she will go to ED or call 911.

## 2020-02-27 NOTE — Telephone Encounter (Signed)
Agree, needs evaluation first.

## 2020-02-27 NOTE — Telephone Encounter (Signed)
Received a call from patient asking for a referral to a cardiologist for SOB and fatigue, did advise that she would need to schedule an appointment first. Offered for her to speak to triage nurse, she declined due to having a hard time speaking english and due to insurance purposes she is unable to come in for an appointment.

## 2020-02-28 DIAGNOSIS — H524 Presbyopia: Secondary | ICD-10-CM | POA: Diagnosis not present

## 2020-02-28 DIAGNOSIS — Z0100 Encounter for examination of eyes and vision without abnormal findings: Secondary | ICD-10-CM | POA: Diagnosis not present

## 2020-03-27 ENCOUNTER — Other Ambulatory Visit: Payer: Self-pay | Admitting: Family Medicine

## 2020-04-11 ENCOUNTER — Encounter: Payer: Self-pay | Admitting: Family Medicine

## 2020-04-11 ENCOUNTER — Ambulatory Visit (INDEPENDENT_AMBULATORY_CARE_PROVIDER_SITE_OTHER): Payer: Medicare HMO | Admitting: Family Medicine

## 2020-04-11 ENCOUNTER — Other Ambulatory Visit: Payer: Self-pay

## 2020-04-11 VITALS — BP 150/98 | HR 83 | Temp 98.3°F | Ht 62.0 in | Wt 154.2 lb

## 2020-04-11 DIAGNOSIS — I10 Essential (primary) hypertension: Secondary | ICD-10-CM

## 2020-04-11 DIAGNOSIS — F339 Major depressive disorder, recurrent, unspecified: Secondary | ICD-10-CM | POA: Diagnosis not present

## 2020-04-11 DIAGNOSIS — M8000XD Age-related osteoporosis with current pathological fracture, unspecified site, subsequent encounter for fracture with routine healing: Secondary | ICD-10-CM

## 2020-04-11 DIAGNOSIS — F5104 Psychophysiologic insomnia: Secondary | ICD-10-CM | POA: Diagnosis not present

## 2020-04-11 DIAGNOSIS — M47816 Spondylosis without myelopathy or radiculopathy, lumbar region: Secondary | ICD-10-CM

## 2020-04-11 MED ORDER — METOPROLOL SUCCINATE ER 100 MG PO TB24: 100.0000 mg | ORAL_TABLET | Freq: Every day | ORAL | 3 refills | Status: DC

## 2020-04-11 MED ORDER — DULOXETINE HCL 20 MG PO CPEP: 20.0000 mg | ORAL_CAPSULE | Freq: Every day | ORAL | 2 refills | Status: DC

## 2020-04-11 NOTE — Progress Notes (Signed)
Subjective  CC:  Chief Complaint  Patient presents with  . Insomnia    does not sleep longer than an hour at a time  . Palpitations    believes it is due to recently stopping "stress medicine," cannot tell me the name of medication - claims medication was giving her nightmares and becoming aggressive    HPI: Veronica Myers is a 79 y.o. female who presents to the office today to address the problems listed above in the chief complaint.  Veronica Myers is complaining of difficulty sleeping.  She reports that she has had problems sleeping for most of her adult life.  In the past, when she started Cymbalta, her sleep and stress decreased significantly.  She is on this for about 3 to 4 years.  When she came to me, she felt it was negatively affecting her: Increase anxiety, aggression and mood irritability.  Since stopping, she has not tolerated any other SSRIs.  She has no new stressors in her life.  Her sleep is not affected by pain.  She is taking over-the-counter melatonin and sleep aids without results.  She tends to have significant adverse effects from multiple medications.  She has not tried Elavil nor trazodone to my knowledge.  Depression: Denies feeling down.  Using over-the-counter stress reliever and feels her stress is well managed.  Low back pain is much improved.  Using over-the-counter supplement.  Osteoporosis: Has received only 1 Prolia injection. Is due at the end of this month.  Not currently taking calcium or vitamin D.  Hypertension follow-up: Denies chest pain shortness of breath or lower extremity edema.  Takes blood pressures at home and they run on average 140-150s over 90s.  She is on Toprol-XL 50 mg daily.  Assessment  1. Psychophysiologic insomnia   2. Major depression, recurrent, chronic (HCC)   3. Essential hypertension   4. Spondylosis of lumbar region without myelopathy or radiculopathy   5. Age-related osteoporosis with current pathological fracture with routine  healing, subsequent encounter      Plan   Insomnia and mood disorder: Patient would like to try Cymbalta again since she had good luck in the past.  She will let me know if this does not work.  Consider trazodone or Elavil.  Hypertension: Increase Toprol-XL to 100 mg daily.  Continue home monitoring.  Back pain is much improved.  Osteoporosis: Schedule return visit for second Prolia injection in a few weeks.  Recommend restarting calcium and vitamin D.  Follow up: Return in about 5 months (around 09/11/2020) for complete physical.  05/23/2020  No orders of the defined types were placed in this encounter.  Meds ordered this encounter  Medications  . DULoxetine (CYMBALTA) 20 MG capsule    Sig: Take 1 capsule (20 mg total) by mouth daily.    Dispense:  30 capsule    Refill:  2  . metoprolol succinate (TOPROL-XL) 100 MG 24 hr tablet    Sig: Take 1 tablet (100 mg total) by mouth daily.    Dispense:  90 tablet    Refill:  3      I reviewed the patients updated PMH, FH, and SocHx.    Patient Active Problem List   Diagnosis Date Noted  . Psychophysiologic insomnia 04/11/2020  . 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 Compression fracture of thoracic vertebra with routine healing 08/07/2019  . Thoracogenic scoliosis of thoracic region 07/16/2019  . Vitiligo 03/31/2018  . Major depression, recurrent, chronic (HCC)  12/12/2017  . Essential hypertension 12/12/2017  . GERD (gastroesophageal reflux disease) 12/12/2017  . History of diverticulitis 12/12/2017  . Osteoporosis 12/12/2017   Current Meds  Medication Sig  . metoprolol succinate (TOPROL-XL) 100 MG 24 hr tablet Take 1 tablet (100 mg total) by mouth daily.  . pantoprazole (PROTONIX) 20 MG tablet TAKE 1 TABLET BY MOUTH EVERY DAY  . [DISCONTINUED] metoprolol succinate (TOPROL-XL) 50 MG 24 hr tablet Take 1 tablet (50 mg total) by mouth daily.    Allergies: Patient has No Known Allergies. Family  History: Patient family history includes Arthritis in her father and mother; Depression in her father and mother; Diabetes in her father and mother; Heart disease in her father and mother; Hypertension in her father and mother. Social History:  Patient  reports that she has never smoked. She has never used smokeless tobacco. She reports previous alcohol use. She reports that she does not use drugs.  Review of Systems: Constitutional: Negative for fever malaise or anorexia Cardiovascular: negative for chest pain Respiratory: negative for SOB or persistent cough Gastrointestinal: negative for abdominal pain  Objective  Vitals: BP (!) 150/98   Pulse 83   Temp 98.3 F (36.8 C) (Temporal)   Ht 5\' 2"  (1.575 m)   Wt 154 lb 3.2 oz (69.9 kg)   SpO2 98%   BMI 28.20 kg/m  General: no acute distress , A&Ox3 HEENT: PEERL, conjunctiva normal, neck is supple Cardiovascular:  RRR without murmur or gallop.  Respiratory:  Good breath sounds bilaterally, CTAB with normal respiratory effort    Commons side effects, risks, benefits, and alternatives for medications and treatment plan prescribed today were discussed, and the patient expressed understanding of the given instructions. Patient is instructed to call or message via MyChart if he/she has any questions or concerns regarding our treatment plan. No barriers to understanding were identified. We discussed Red Flag symptoms and signs in detail. Patient expressed understanding regarding what to do in case of urgent or emergency type symptoms.   Medication list was reconciled, printed and provided to the patient in AVS. Patient instructions and summary information was reviewed with the patient as documented in the AVS. This note was prepared with assistance of Dragon voice recognition software. Occasional wrong-word or sound-a-like substitutions may have occurred due to the inherent limitations of voice recognition software  This visit occurred during  the SARS-CoV-2 public health emergency.  Safety protocols were in place, including screening questions prior to the visit, additional usage of staff PPE, and extensive cleaning of exam room while observing appropriate contact time as indicated for disinfecting solutions.

## 2020-04-11 NOTE — Patient Instructions (Signed)
Please return in January 2022 for your annual complete physical; please come fasting.  Please start cymbalta once a day to see if it helps your sleep and stress again.  I've ordered a higher dose of your metoprolol to lower your blood pressure. Please stop the 50mg  and take the 100mg  dose daily.   Please take calcium and Vit D daily for your bone health.  If you have any questions or concerns, please don't hesitate to send me a message via MyChart or call the office at [phone]. Thank you for visiting with today! It's our pleasure caring for you.

## 2020-05-06 ENCOUNTER — Other Ambulatory Visit: Payer: Self-pay | Admitting: Family Medicine

## 2020-05-22 ENCOUNTER — Ambulatory Visit (INDEPENDENT_AMBULATORY_CARE_PROVIDER_SITE_OTHER): Payer: Medicare HMO | Admitting: *Deleted

## 2020-05-22 ENCOUNTER — Encounter: Payer: Self-pay | Admitting: *Deleted

## 2020-05-22 ENCOUNTER — Other Ambulatory Visit: Payer: Self-pay

## 2020-05-22 DIAGNOSIS — M8000XD Age-related osteoporosis with current pathological fracture, unspecified site, subsequent encounter for fracture with routine healing: Secondary | ICD-10-CM

## 2020-05-22 MED ORDER — DENOSUMAB 60 MG/ML ~~LOC~~ SOSY
60.0000 mg | PREFILLED_SYRINGE | Freq: Once | SUBCUTANEOUS | Status: AC
  Administered 2020-05-22: 60 mg via SUBCUTANEOUS

## 2020-05-22 NOTE — Progress Notes (Signed)
Per orders of Dr. Mardelle Matte , injection of Prolia 60 mg/ml given by Corky Mull, LPN in right arm SQ Patient tolerated injection well. Patient will make appointment for 6 months.

## 2020-05-23 ENCOUNTER — Ambulatory Visit: Payer: Medicare HMO | Admitting: Family Medicine

## 2020-06-27 ENCOUNTER — Other Ambulatory Visit: Payer: Self-pay

## 2020-06-27 ENCOUNTER — Ambulatory Visit (INDEPENDENT_AMBULATORY_CARE_PROVIDER_SITE_OTHER): Payer: Medicare HMO | Admitting: Family Medicine

## 2020-06-27 ENCOUNTER — Encounter: Payer: Self-pay | Admitting: Family Medicine

## 2020-06-27 VITALS — BP 136/80 | HR 65 | Temp 97.3°F | Wt 156.4 lb

## 2020-06-27 DIAGNOSIS — H6121 Impacted cerumen, right ear: Secondary | ICD-10-CM

## 2020-06-27 NOTE — Patient Instructions (Addendum)
Please follow up as scheduled for your next visit with me: 09/11/2020   If you have any questions or concerns, please don't hesitate to send me a message via MyChart or call the office at [phone]. Thank you for visiting with [place] today! It's our pleasure caring for you.  Start using otc DEBROX drops in your ears nightly or twice a day to soften the wax and then return in 2 weeks for irrigation. Stop using q tips.    Earwax Buildup, Adult The ears produce a substance called earwax that helps keep bacteria out of the ear and protects the skin in the ear canal. Occasionally, earwax can build up in the ear and cause discomfort or hearing loss. What increases the risk? This condition is more likely to develop in people who:  Are female.  Are elderly.  Naturally produce more earwax.  Clean their ears often with cotton swabs.  Use earplugs often.  Use in-ear headphones often.  Wear hearing aids.  Have narrow ear canals.  Have earwax that is overly thick or sticky.  Have eczema.  Are dehydrated.  Have excess hair in the ear canal. What are the signs or symptoms? Symptoms of this condition include:  Reduced or muffled hearing.  A feeling of fullness in the ear or feeling that the ear is plugged.  Fluid coming from the ear.  Ear pain.  Ear itch.  Ringing in the ear.  Coughing.  An obvious piece of earwax that can be seen inside the ear canal. How is this diagnosed? This condition may be diagnosed based on:  Your symptoms.  Your medical history.  An ear exam. During the exam, your health care provider will look into your ear with an instrument called an otoscope. You may have tests, including a hearing test. How is this treated? This condition may be treated by:  Using ear drops to soften the earwax.  Having the earwax removed by a health care provider. The health care provider may: ? Flush the ear with water. ? Use an instrument that has a loop on the end  (curette). ? Use a suction device.  Surgery to remove the wax buildup. This may be done in severe cases. Follow these instructions at home:   Take over-the-counter and prescription medicines only as told by your health care provider.  Do not put any objects, including cotton swabs, into your ear. You can clean the opening of your ear canal with a washcloth or facial tissue.  Follow instructions from your health care provider about cleaning your ears. Do not over-clean your ears.  Drink enough fluid to keep your urine clear or pale yellow. This will help to thin the earwax.  Keep all follow-up visits as told by your health care provider. If earwax builds up in your ears often or if you use hearing aids, consider seeing your health care provider for routine, preventive ear cleanings. Ask your health care provider how often you should schedule your cleanings.  If you have hearing aids, clean them according to instructions from the manufacturer and your health care provider. Contact a health care provider if:  You have ear pain.  You develop a fever.  You have blood, pus, or other fluid coming from your ear.  You have hearing loss.  You have ringing in your ears that does not go away.  Your symptoms do not improve with treatment.  You feel like the room is spinning (vertigo). Summary  Earwax can build up in  the ear and cause discomfort or hearing loss.  The most common symptoms of this condition include reduced or muffled hearing and a feeling of fullness in the ear or feeling that the ear is plugged.  This condition may be diagnosed based on your symptoms, your medical history, and an ear exam.  This condition may be treated by using ear drops to soften the earwax or by having the earwax removed by a health care provider.  Do not put any objects, including cotton swabs, into your ear. You can clean the opening of your ear canal with a washcloth or facial tissue. This  information is not intended to replace advice given to you by your health care provider. Make sure you discuss any questions you have with your health care provider. Document Revised: 07/29/2017 Document Reviewed: 10/27/2016 Elsevier Patient Education  2020 [employer].

## 2020-06-27 NOTE — Progress Notes (Signed)
Subjective  CC:  Chief Complaint  Patient presents with  . Ear Fullness    right ear x two weeks   . sinus drainage    HPI: Veronica Myers is a 79 y.o. female who presents to the office today to address the problems listed above in the chief complaint.  Can't hear out of right ear x 2 weeks; uses q tips. occ sharp pain but nothing constant. Has allergies. Doesn't feel sick. No f/c/s or cough.    Assessment  1. Impacted cerumen of right ear   2. Hearing loss of right ear due to cerumen impaction      Plan   Cerumen impaction:  Debrox, education and return in 2 weeks.   Follow up: Return for as scheduled.  09/11/2020  No orders of the defined types were placed in this encounter.  No orders of the defined types were placed in this encounter.     I reviewed the patients updated PMH, FH, and SocHx.    Patient Active Problem List   Diagnosis Date Noted  . Psychophysiologic insomnia 04/11/2020  . 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 Compression fracture of thoracic vertebra with routine healing 08/07/2019  . Thoracogenic scoliosis of thoracic region 07/16/2019  . Vitiligo 03/31/2018  . Major depression, recurrent, chronic (HCC) 12/12/2017  . Essential hypertension 12/12/2017  . GERD (gastroesophageal reflux disease) 12/12/2017  . History of diverticulitis 12/12/2017  . Osteoporosis 12/12/2017   Current Meds  Medication Sig  . DULoxetine (CYMBALTA) 20 MG capsule TAKE 1 CAPSULE BY MOUTH EVERY DAY  . metoprolol succinate (TOPROL-XL) 100 MG 24 hr tablet Take 1 tablet (100 mg total) by mouth daily.  . Multiple Vitamins-Minerals (ZINC PO) Take by mouth.   . Omega-3 Krill Oil 500 MG CAPS Take by mouth.   . pantoprazole (PROTONIX) 20 MG tablet TAKE 1 TABLET BY MOUTH EVERY DAY    Allergies: Patient has No Known Allergies. Family History: Patient family history includes Arthritis in her father and mother; Depression in her father and mother;  Diabetes in her father and mother; Heart disease in her father and mother; Hypertension in her father and mother. Social History:  Patient  reports that she has never smoked. She has never used smokeless tobacco. She reports previous alcohol use. She reports that she does not use drugs.  Review of Systems: Constitutional: Negative for fever malaise or anorexia Cardiovascular: negative for chest pain Respiratory: negative for SOB or persistent cough Gastrointestinal: negative for abdominal pain  Objective  Vitals: BP 136/80   Pulse 65   Temp (!) 97.3 F (36.3 C) (Temporal)   Wt 156 lb 6.4 oz (70.9 kg)   SpO2 97%   BMI 28.61 kg/m  General: no acute distress , A&Ox3 HEENT: PEERL, conjunctiva normal, neck is supple, right tm with cerumen impaction left TM nl. Hard wax unable to clear with irrigation nor curette.    Commons side effects, risks, benefits, and alternatives for medications and treatment plan prescribed today were discussed, and the patient expressed understanding of the given instructions. Patient is instructed to call or message via MyChart if he/she has any questions or concerns regarding our treatment plan. No barriers to understanding were identified. We discussed Red Flag symptoms and signs in detail. Patient expressed understanding regarding what to do in case of urgent or emergency type symptoms.   Medication list was reconciled, printed and provided to the patient in AVS. Patient instructions and summary information was reviewed with  the patient as documented in the AVS. This note was prepared with assistance of Dragon voice recognition software. Occasional wrong-word or sound-a-like substitutions may have occurred due to the inherent limitations of voice recognition software  This visit occurred during the SARS-CoV-2 public health emergency.  Safety protocols were in place, including screening questions prior to the visit, additional usage of staff PPE, and extensive  cleaning of exam room while observing appropriate contact time as indicated for disinfecting solutions.

## 2020-07-18 ENCOUNTER — Encounter: Payer: Self-pay | Admitting: Family Medicine

## 2020-07-18 ENCOUNTER — Other Ambulatory Visit: Payer: Self-pay

## 2020-07-18 ENCOUNTER — Ambulatory Visit (INDEPENDENT_AMBULATORY_CARE_PROVIDER_SITE_OTHER): Payer: Medicare HMO | Admitting: Family Medicine

## 2020-07-18 VITALS — BP 120/70 | HR 66 | Temp 97.4°F | Wt 156.0 lb

## 2020-07-18 DIAGNOSIS — H6121 Impacted cerumen, right ear: Secondary | ICD-10-CM

## 2020-07-18 DIAGNOSIS — L219 Seborrheic dermatitis, unspecified: Secondary | ICD-10-CM | POA: Diagnosis not present

## 2020-07-18 MED ORDER — TRIAMCINOLONE ACETONIDE 0.1 % EX CREA: 1.0000 "application " | TOPICAL_CREAM | Freq: Two times a day (BID) | CUTANEOUS | 0 refills | Status: DC

## 2020-07-18 NOTE — Patient Instructions (Signed)
Please follow up as scheduled for your next visit with me: 09/11/2020   If you have any questions or concerns, please don't hesitate to send me a message via MyChart or call the office at [phone]. Thank you for visiting with [place] today! It's our pleasure caring for you.  Seborrheic Dermatitis, Adult Seborrheic dermatitis is a skin disease that causes red, scaly patches. It usually occurs on the scalp, and it is often called dandruff. The patches may appear on other parts of the body. Skin patches tend to appear where there are many oil glands in the skin. Areas of the body that are commonly affected include:  Scalp.  Skin folds of the body.  Ears.  Eyebrows.  Neck.  Face.  Armpits.  The bearded area of men's faces. The condition may come and go for no known reason, and it is often long-lasting (chronic). What are the causes? The cause of this condition is not known. What increases the risk? This condition is more likely to develop in people who:  Have certain conditions, such as: ? HIV (human immunodeficiency virus). ? AIDS (acquired immunodeficiency syndrome). ? Parkinson disease. ? Mood disorders, such as depression.  Are 76-72 years old. What are the signs or symptoms? Symptoms of this condition include:  Thick scales on the scalp.  Redness on the face or in the armpits.  Skin that is flaky. The flakes may be white or yellow.  Skin that seems oily or dry but is not helped with moisturizers.  Itching or burning in the affected areas. How is this diagnosed? This condition is diagnosed with a medical history and physical exam. A sample of your skin may be tested (skin biopsy). You may need to see a skin specialist (dermatologist). How is this treated? There is no cure for this condition, but treatment can help to manage the symptoms. You may get treatment to remove scales, lower the risk of skin infection, and reduce swelling or itching. Treatment may  include:  Creams that reduce swelling and irritation (steroids).  Creams that reduce skin yeast.  Medicated shampoo, soaps, moisturizing creams, or ointments.  Medicated moisturizing creams or ointments. Follow these instructions at home:  Apply over-the-counter and prescription medicines only as told by your health care provider.  Use any medicated shampoo, soaps, skin creams, or ointments only as told by your health care provider.  Keep all follow-up visits as told by your health care provider. This is important. Contact a health care provider if:  Your symptoms do not improve with treatment.  Your symptoms get worse.  You have new symptoms. This information is not intended to replace advice given to you by your health care provider. Make sure you discuss any questions you have with your health care provider. Document Revised: 07/29/2017 Document Reviewed: 12/04/2015 Elsevier Patient Education  2020 [employer].

## 2020-07-18 NOTE — Progress Notes (Signed)
Subjective  CC:  Chief Complaint  Patient presents with  . impacted cerumen of right ear  . Nail Problem    brittle nails  . Rash    back of neck    HPI: Veronica Myers is a 79 y.o. female who presents to the office today to address the problems listed above in the chief complaint.  Cerumen impaction: Right.  See last note.  Still can hear a value.  He has been using Debrox drops.  Feels like it is soft and is she can feel things moving now.  No longer using Q-tips  Complains of itchy red rash to the back of her scalp.  On and off.  No other lesions.  Brittle nails Assessment  1. Impacted cerumen of right ear   2. Seborrheic dermatitis      Plan   Impacted cerumen: Irrigation removed large cerumen ball with head of cotton ball Q-tip.  Patient can hear.  TM normal.  Seborrheic dermatitis: Start triamcinolone cream twice daily as needed.  Education given.  See after visit summary.  Follow up: For CPE  09/11/2020  No orders of the defined types were placed in this encounter.  No orders of the defined types were placed in this encounter.     I reviewed the patients updated PMH, FH, and SocHx.    Patient Active Problem List   Diagnosis Date Noted  . Psychophysiologic insomnia 04/11/2020  . 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 Compression fracture of thoracic vertebra with routine healing 08/07/2019  . Thoracogenic scoliosis of thoracic region 07/16/2019  . Vitiligo 03/31/2018  . Major depression, recurrent, chronic (HCC) 12/12/2017  . Essential hypertension 12/12/2017  . GERD (gastroesophageal reflux disease) 12/12/2017  . History of diverticulitis 12/12/2017  . Osteoporosis 12/12/2017   Current Meds  Medication Sig  . DULoxetine (CYMBALTA) 20 MG capsule TAKE 1 CAPSULE BY MOUTH EVERY DAY  . metoprolol succinate (TOPROL-XL) 100 MG 24 hr tablet Take 1 tablet (100 mg total) by mouth daily.  . Multiple Vitamins-Minerals (ZINC PO) Take by  mouth.   . Omega-3 Krill Oil 500 MG CAPS Take by mouth.   . pantoprazole (PROTONIX) 20 MG tablet TAKE 1 TABLET BY MOUTH EVERY DAY    Allergies: Patient has No Known Allergies. Family History: Patient family history includes Arthritis in her father and mother; Depression in her father and mother; Diabetes in her father and mother; Heart disease in her father and mother; Hypertension in her father and mother. Social History:  Patient  reports that she has never smoked. She has never used smokeless tobacco. She reports previous alcohol use. She reports that she does not use drugs.  Review of Systems: Constitutional: Negative for fever malaise or anorexia Cardiovascular: negative for chest pain Respiratory: negative for SOB or persistent cough Gastrointestinal: negative for abdominal pain  Objective  Vitals: BP 120/70   Pulse 66   Temp (!) 97.4 F (36.3 C) (Temporal)   Wt 156 lb (70.8 kg)   SpO2 97%   BMI 28.53 kg/m  General: no acute distress , A&Ox3 Right TM normal after cerumen irrigation. Posterior scalp line with patchy red eczematous flaking     Commons side effects, risks, benefits, and alternatives for medications and treatment plan prescribed today were discussed, and the patient expressed understanding of the given instructions. Patient is instructed to call or message via MyChart if he/she has any questions or concerns regarding our treatment plan. No barriers to understanding were  identified. We discussed Red Flag symptoms and signs in detail. Patient expressed understanding regarding what to do in case of urgent or emergency type symptoms.   Medication list was reconciled, printed and provided to the patient in AVS. Patient instructions and summary information was reviewed with the patient as documented in the AVS. This note was prepared with assistance of Dragon voice recognition software. Occasional wrong-word or sound-a-like substitutions may have occurred due to the  inherent limitations of voice recognition software  This visit occurred during the SARS-CoV-2 public health emergency.  Safety protocols were in place, including screening questions prior to the visit, additional usage of staff PPE, and extensive cleaning of exam room while observing appropriate contact time as indicated for disinfecting solutions.

## 2020-07-29 ENCOUNTER — Other Ambulatory Visit: Payer: Self-pay | Admitting: Family Medicine

## 2020-09-11 ENCOUNTER — Other Ambulatory Visit: Payer: Self-pay

## 2020-09-11 ENCOUNTER — Ambulatory Visit (INDEPENDENT_AMBULATORY_CARE_PROVIDER_SITE_OTHER): Payer: Medicare HMO | Admitting: Family Medicine

## 2020-09-11 ENCOUNTER — Encounter: Payer: Self-pay | Admitting: Family Medicine

## 2020-09-11 VITALS — BP 122/84 | HR 57 | Temp 97.8°F | Ht 62.0 in | Wt 154.2 lb

## 2020-09-11 DIAGNOSIS — K219 Gastro-esophageal reflux disease without esophagitis: Secondary | ICD-10-CM

## 2020-09-11 DIAGNOSIS — E782 Mixed hyperlipidemia: Secondary | ICD-10-CM

## 2020-09-11 DIAGNOSIS — I10 Essential (primary) hypertension: Secondary | ICD-10-CM

## 2020-09-11 DIAGNOSIS — M8000XD Age-related osteoporosis with current pathological fracture, unspecified site, subsequent encounter for fracture with routine healing: Secondary | ICD-10-CM | POA: Diagnosis not present

## 2020-09-11 DIAGNOSIS — F339 Major depressive disorder, recurrent, unspecified: Secondary | ICD-10-CM

## 2020-09-11 DIAGNOSIS — Z0000 Encounter for general adult medical examination without abnormal findings: Secondary | ICD-10-CM | POA: Diagnosis not present

## 2020-09-11 DIAGNOSIS — Z1231 Encounter for screening mammogram for malignant neoplasm of breast: Secondary | ICD-10-CM

## 2020-09-11 DIAGNOSIS — F5104 Psychophysiologic insomnia: Secondary | ICD-10-CM

## 2020-09-11 DIAGNOSIS — Z79899 Other long term (current) drug therapy: Secondary | ICD-10-CM | POA: Diagnosis not present

## 2020-09-11 DIAGNOSIS — Z8781 Personal history of (healed) traumatic fracture: Secondary | ICD-10-CM | POA: Diagnosis not present

## 2020-09-11 DIAGNOSIS — M47816 Spondylosis without myelopathy or radiculopathy, lumbar region: Secondary | ICD-10-CM | POA: Diagnosis not present

## 2020-09-11 LAB — COMPREHENSIVE METABOLIC PANEL
ALT: 18 U/L (ref 0–35)
AST: 24 U/L (ref 0–37)
Albumin: 4 g/dL (ref 3.5–5.2)
Alkaline Phosphatase: 59 U/L (ref 39–117)
BUN: 10 mg/dL (ref 6–23)
CO2: 30 mEq/L (ref 19–32)
Calcium: 9.1 mg/dL (ref 8.4–10.5)
Chloride: 102 mEq/L (ref 96–112)
Creatinine, Ser: 0.84 mg/dL (ref 0.40–1.20)
GFR: 66.69 mL/min (ref >60.00)
Glucose, Bld: 97 mg/dL (ref 70–99)
Potassium: 4.3 mEq/L (ref 3.5–5.1)
Sodium: 137 mEq/L (ref 135–145)
Total Bilirubin: 0.4 mg/dL (ref 0.2–1.2)
Total Protein: 6.6 g/dL (ref 6.0–8.3)

## 2020-09-11 LAB — CBC WITH DIFFERENTIAL/PLATELET
Basophils Absolute: 0.1 10*3/uL (ref 0.0–0.1)
Basophils Relative: 1 % (ref 0.0–3.0)
Eosinophils Absolute: 0.1 10*3/uL (ref 0.0–0.7)
Eosinophils Relative: 1.6 % (ref 0.0–5.0)
HCT: 41.5 % (ref 36.0–46.0)
Hemoglobin: 14.1 g/dL (ref 12.0–15.0)
Lymphocytes Relative: 32.3 % (ref 12.0–46.0)
Lymphs Abs: 1.8 10*3/uL (ref 0.7–4.0)
MCHC: 34.1 g/dL (ref 30.0–36.0)
MCV: 96.7 fl (ref 78.0–100.0)
Monocytes Absolute: 0.4 10*3/uL (ref 0.1–1.0)
Monocytes Relative: 6.6 % (ref 3.0–12.0)
Neutro Abs: 3.2 10*3/uL (ref 1.4–7.7)
Neutrophils Relative %: 58.5 % (ref 43.0–77.0)
Platelets: 220 10*3/uL (ref 150.0–400.0)
RBC: 4.29 Mil/uL (ref 3.87–5.11)
RDW: 12.6 % (ref 11.5–15.5)
WBC: 5.5 10*3/uL (ref 4.0–10.5)

## 2020-09-11 LAB — LIPID PANEL
Cholesterol: 188 mg/dL (ref 0–200)
HDL: 55.9 mg/dL (ref >39.00)
LDL Cholesterol: 108 mg/dL — ABNORMAL HIGH (ref 0–99)
NonHDL: 131.85
Total CHOL/HDL Ratio: 3
Triglycerides: 118 mg/dL (ref 0.0–149.0)
VLDL: 23.6 mg/dL (ref 0.0–40.0)

## 2020-09-11 LAB — TSH: TSH: 3.16 u[IU]/mL (ref 0.35–4.50)

## 2020-09-11 LAB — VITAMIN B12: Vitamin B-12: 590 pg/mL (ref 211–911)

## 2020-09-11 MED ORDER — SHINGRIX 50 MCG/0.5ML IM SUSR: 0.5000 mL | Freq: Once | INTRAMUSCULAR | 0 refills | Status: AC

## 2020-09-11 MED ORDER — ROSUVASTATIN CALCIUM 10 MG PO TABS: 10.0000 mg | ORAL_TABLET | Freq: Every day | ORAL | 3 refills | Status: DC

## 2020-09-11 NOTE — Patient Instructions (Signed)
Please return in 6 months for hypertension follow up.  I will release your lab results to you on your MyChart account with further instructions. Please reply with any questions.   Please take the prescription for Shingrix to the pharmacy so they may administer the vaccination. Your insurance will then cover the injection. You had one so you need the second dose.  Please get your covid booster vaccine when you can as well.  Please start crestor 10mg  nightly to lower your cardiovascular risk and cholesterol.  If you have any questions or concerns, please don't hesitate to send me a message via MyChart or call the office at [phone]. Thank you for visiting with [phone] today! It's our pleasure caring for you.  I'm glad you are doing well!

## 2020-09-11 NOTE — Progress Notes (Signed)
Subjective  Chief Complaint  Patient presents with  . Annual Exam    fasting  . Hypertension  . Depression  . Insomnia    HPI: Veronica Myers is a 80 y.o. female who presents to [facility] [place] today for a Female Wellness Visit. She also has the concerns and/or needs as listed above in the chief complaint. These will be addressed in addition to the Health Maintenance Visit.   Wellness Visit: annual visit with health maintenance review and exam without Pap   HM: due mammo in June.  She is doing very well.  Feels "more like herself".  Mood is good.  Sleep remains a mild problem but this is chronic for her.  She is never been a good sleeper.  Eating well.  Immunizations: Needs second dose of Shingrix vaccine.  Due from COVID booster.  Other immunizations are up-to-date  Chronic disease f/u and/or acute problem visit: (deemed necessary to be done in addition to the wellness visit):  Osteoporosis: s/p prolia x 2 injections. Due again in march. On ca and vit d. Will defer next dexa for one year to complete prolia injections x 2.   HTN: Feeling well. Taking medications w/o adverse effects. No symptoms of CHF, angina; no palpitations, sob, cp or lower extremity edema. Compliant with meds.  We increased the dose of her beta-blocker at her last visit and blood pressure is now better controlled.  No symptoms of hypotension or symptomatic bradycardia.  Insomnia: Chronic but mildly improved overall.  Does not feel she needs more help with this at this time.  Chronic major depression with anxiety: We restarted Cymbalta at last visit.  Mood has now stabilized.  She is feeling much better.  Happy.  Did well through the holidays.  No adverse effects.  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  Keeping more active.  GERD: Well-controlled on chronic PPI.  Assessment  1. Annual physical exam   2. Encounter for screening mammogram for breast cancer   3. Essential  hypertension   4. Age-related osteoporosis with current pathological fracture with routine healing, subsequent encounter   5. Major depression, recurrent, chronic (HCC)   6. Spondylosis of lumbar region without myelopathy or radiculopathy   7. Gastroesophageal reflux disease, unspecified whether esophagitis present   8. History of compression fracture of spine   9. Psychophysiologic insomnia   10. Mixed hyperlipidemia   11. Long-term current use of proton pump inhibitor therapy      Plan  Female Wellness Visit:  Age appropriate Health Maintenance and Prevention measures were discussed with patient. Included topics are cancer screening recommendations, ways to keep healthy (see AVS) including dietary and exercise recommendations, regular eye and dental care, use of seat belts, and avoidance of moderate alcohol use and tobacco use.  Mammogram due this summer.  BMI: discussed patient's BMI and encouraged positive lifestyle modifications to help get to or maintain a target BMI.  HM needs and immunizations were addressed and ordered. See below for orders. See HM and immunization section for updates.  Prescription for Shingrix, second dose given.  Recommend COVID booster  Routine labs and screening tests ordered including cmp, cbc and lipids where appropriate.  Discussed recommendations regarding Vit D and calcium supplementation (see AVS)  Chronic disease management visit and/or acute problem visit:  Osteoporosis: Deferred DEXA for 1 year to complete 2 years of Prolia.  Next injection due in March.  Tolerating well.  Continue calcium, vitamin D and  exercise.  Hypertension is now well controlled.  Continue beta-blocker.  Check renal function and electrolytes.  Hyperlipidemia: Discussed elevated risk factors for cardiovascular disease.  Start Crestor 10 mg recheck today.  Long-term PPI: Check for vitamin B12 deficiency  GERD: Well-controlled  Osteoarthritis: Currently stable.  Supportive  care as needed.  Follow up: 6 months for Hypertension follow up, March for Prolia injection Orders Placed This Encounter  Procedures  . MM DIGITAL SCREENING BILATERAL  . CBC with Differential/Platelet  . Comprehensive metabolic panel  . Lipid panel  . TSH  . Vitamin B12   Meds ordered this encounter  Medications  . Zoster Vaccine Adjuvanted [facility]) injection    Sig: Inject 0.5 mLs into the muscle once for 1 dose. Please give 2nd dose 2-6 months after first dose    Dispense:  1 each    Refill:  0    Needs 2nd dose;  . rosuvastatin (CRESTOR) 10 MG tablet    Sig: Take 1 tablet (10 mg total) by mouth daily.    Dispense:  90 tablet    Refill:  3      Lifestyle: Body mass index is 28.2 kg/m. Wt Readings from Last 3 Encounters:  09/11/20 154 lb 3.2 oz (69.9 kg)  07/18/20 156 lb (70.8 kg)  06/27/20 156 lb 6.4 oz (70.9 kg)    Patient Active Problem List   Diagnosis Date Noted  . Psychophysiologic insomnia 04/11/2020  . 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 Compression fracture of thoracic vertebra with routine healing 08/07/2019  . Thoracogenic scoliosis of thoracic region 07/16/2019  . Vitiligo 03/31/2018  . Major depression, recurrent, chronic (HCC) 12/12/2017    On cymbalta and wellbutrin in past.(from record review) Started low dose prozac in 08/2019 for recurrent sxs.    . Essential hypertension 12/12/2017  . GERD (gastroesophageal reflux disease) 12/12/2017  . History of diverticulitis 12/12/2017  . Osteoporosis 12/12/2017    Dexa 02/2018 t + -4.1 lowest spine with compression fracture of spine 08/2019; From old medical records. No dexa report attached. Had been on fosamax.  Started prolia 10/2019    Health Maintenance  Topic Date Due  . COVID-19 Vaccine (3 - Booster for Pfizer series) 07/13/2020  . MAMMOGRAM  01/28/2021  . DEXA SCAN  03/09/2021  . INFLUENZA VACCINE  Completed  . PNA vac Low Risk Adult  Completed  . Hepatitis C  Screening  Discontinued   Immunization History  Administered Date(s) Administered  . Fluad Quad(high Dose 65+) 07/16/2019  . Influenza, High Dose Seasonal PF 07/04/2020  . PFIZER SARS-COV-2 Vaccination 12/21/2019, 01/11/2020  . Pneumococcal Conjugate-13 03/30/2018  . Pneumococcal Polysaccharide-23 07/16/2019  . Zoster Recombinat (Shingrix) 03/30/2018   We updated and reviewed the patient's past history in detail and it is documented below. Allergies: Patient has No Known Allergies. 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 Past Surgical History Patient  has no past surgical history on file. Family History: Patient family history includes Arthritis in her father and mother; Depression in her father and mother; Diabetes in her father and mother; Heart disease in her father and mother; Hypertension in her father and mother. Social History:  Patient  reports that she has never smoked. She has never used smokeless tobacco. She reports previous alcohol use. She reports that she does not use drugs.  Review of Systems: Constitutional:  negative for fever or malaise Ophthalmic: negative for photophobia, double vision or loss of vision Cardiovascular: negative for chest pain, dyspnea on exertion, or new LE swelling Respiratory: negative for SOB or persistent cough Gastrointestinal: negative for abdominal pain, change in bowel habits or melena Genitourinary: negative for dysuria or gross hematuria, no abnormal uterine bleeding or disharge Musculoskeletal: negative for new gait disturbance or muscular weakness Integumentary: negative for new or persistent rashes, no breast lumps Neurological: negative for TIA or stroke symptoms Psychiatric: negative for SI or  delusions Allergic/Immunologic: negative for hives  Patient Care Team    Relationship Specialty Notifications Start End  Willow Ora, MD PCP - General Family Medicine  12/12/17   Rodolph Bong, MD Consulting Physician Sports Medicine  09/10/19     Objective  Vitals: BP 122/84   Pulse (!) 57   Temp 97.8 F (36.6 C) (Temporal)   Ht 5\' 2"  (1.575 m)   Wt 154 lb 3.2 oz (69.9 kg)   SpO2 99%   BMI 28.20 kg/m  General:  Well developed, well nourished, no acute distress  Psych:  Alert and orientedx3,normal mood and affect, appears well today.  No anxiety.  Appears happy HEENT:  Normocephalic, atraumatic, non-icteric sclera,  supple neck without adenopathy, mass or thyromegaly Cardiovascular:  Normal S1, S2, RRR without gallop, rub or murmur Respiratory:  Good breath sounds bilaterally, CTAB with normal respiratory effort Gastrointestinal: normal bowel sounds, soft, non-tender, no noted masses. No HSM MSK: no deformities, contusions. Joints are without erythema or swelling.  Skin:  Warm, no rashes or suspicious lesions noted Neurologic:    Mental status is normal. Gross motor and sensory exams are normal. Normal gait. No tremor    Commons side effects, risks, benefits, and alternatives for medications and treatment plan prescribed today were discussed, and the patient expressed understanding of the given instructions. Patient is instructed to call or message via MyChart if he/she has any questions or concerns regarding our treatment plan. No barriers to understanding were identified. We discussed Red Flag symptoms and signs in detail. Patient expressed understanding regarding what to do in case of urgent or emergency type symptoms.   Medication list was reconciled, printed and provided to the patient in AVS. Patient instructions and summary information was reviewed with the patient as documented in the AVS. This note was prepared with assistance of Dragon voice recognition software.  Occasional wrong-word or sound-a-like substitutions may have occurred due to the inherent limitations of voice recognition software  This visit occurred during the SARS-CoV-2 public health emergency.  Safety protocols were in place, including screening questions prior to the visit, additional usage of staff PPE, and extensive cleaning of exam room while observing appropriate contact time as indicated for disinfecting solutions.

## 2020-09-30 ENCOUNTER — Other Ambulatory Visit: Payer: Self-pay | Admitting: Family Medicine

## 2020-11-18 ENCOUNTER — Telehealth: Payer: Self-pay

## 2020-11-18 NOTE — Telephone Encounter (Signed)
Patient has been approved for Prolia from 08/27/2019-08/29/2021  Approved EOC # [id number]

## 2020-11-19 ENCOUNTER — Other Ambulatory Visit: Payer: Self-pay

## 2020-11-19 ENCOUNTER — Ambulatory Visit (INDEPENDENT_AMBULATORY_CARE_PROVIDER_SITE_OTHER): Payer: Medicare HMO

## 2020-11-19 DIAGNOSIS — M8000XD Age-related osteoporosis with current pathological fracture, unspecified site, subsequent encounter for fracture with routine healing: Secondary | ICD-10-CM | POA: Diagnosis not present

## 2020-11-19 MED ORDER — DENOSUMAB 60 MG/ML ~~LOC~~ SOSY
60.0000 mg | PREFILLED_SYRINGE | Freq: Once | SUBCUTANEOUS | Status: AC
Start: 2020-11-19 — End: 2020-11-19
  Administered 2020-11-19: 60 mg via SUBCUTANEOUS

## 2020-11-19 NOTE — Progress Notes (Signed)
Veronica Myers 80 yr old female presents to office today for Prolia injection per Asencion Partridge, MD. Administered DENOSUMAB 60 mg/mL subcutaneous right arm. Patient tolerated well. Patient scheduled next injection 6 months and 1 day from now. PA approved 08/29/2021

## 2020-12-08 ENCOUNTER — Ambulatory Visit (INDEPENDENT_AMBULATORY_CARE_PROVIDER_SITE_OTHER): Payer: Medicare HMO | Admitting: Family Medicine

## 2020-12-08 ENCOUNTER — Encounter: Payer: Self-pay | Admitting: Family Medicine

## 2020-12-08 ENCOUNTER — Other Ambulatory Visit: Payer: Self-pay

## 2020-12-08 ENCOUNTER — Ambulatory Visit (INDEPENDENT_AMBULATORY_CARE_PROVIDER_SITE_OTHER): Payer: Medicare HMO

## 2020-12-08 VITALS — BP 134/80 | HR 63 | Temp 97.7°F | Wt 160.6 lb

## 2020-12-08 DIAGNOSIS — M79671 Pain in right foot: Secondary | ICD-10-CM

## 2020-12-08 DIAGNOSIS — M19071 Primary osteoarthritis, right ankle and foot: Secondary | ICD-10-CM | POA: Diagnosis not present

## 2020-12-08 DIAGNOSIS — M7731 Calcaneal spur, right foot: Secondary | ICD-10-CM | POA: Diagnosis not present

## 2020-12-08 MED ORDER — DICLOFENAC SODIUM 1 % EX GEL: 2.0000 g | Freq: Four times a day (QID) | CUTANEOUS | 5 refills | Status: DC | PRN

## 2020-12-08 NOTE — Patient Instructions (Signed)
Please follow up as scheduled for your next visit with me: 03/13/2021   Use the topical voltaren gel up to 4 x/ day for pain and you may take Tylenol as needed. Wear a supportive shoe.  I will let you know what the xray shows. I think your pain is due to arthritis.   If you have any questions or concerns, please don't hesitate to send me a message via MyChart or call the office at [phone]. Thank you for visiting with [place] today! It's our pleasure caring for you.

## 2020-12-08 NOTE — Progress Notes (Signed)
Subjective  CC:  Chief Complaint  Patient presents with  . Foot Swelling    Right foot. Started over a month ago. Increased pain and swelling with walking/exercise. Has been elevating feet and icing/heat - minimal help     HPI: Veronica Myers is a 80 y.o. female who presents to the office today to address the problems listed above in the chief complaint.  80 year old presents with about 4-week history of right midfoot pain with walking.  She is not sure if she injured it while cleaning blinds which fell and landed on her foot or while she was getting off a raised chair and hit her foot on the side of the leg, however she complains of almost daily pain with walking.  She is noticed a small area that at times seem a little bit more prominent.  She has no bruising.  Rarely she will have sharp stabbing pain in the middle of the night.  Typically, she is off of the foot, she has no pain.  No ankle or heel pain.  No prior foot injuries.  She used Advil just once with mild relief of pain.  She likes to avoid medications if possible.   Assessment  1. Pain of right midfoot      Plan   Mid foot pain: Right, likely due to osteoarthritis.  Will check x-ray.  Exam is unremarkable.  Topical Voltaren gel recommended with Tylenol as needed.  Supported shoes recommended.  Follow-up if worsens  Follow up: As scheduled 03/13/2021  Orders Placed This Encounter  Procedures  . DG Foot Complete Right   Meds ordered this encounter  Medications  . diclofenac Sodium (VOLTAREN) 1 % GEL    Sig: Apply 2 g topically 4 (four) times daily as needed (foot pain).    Dispense:  150 g    Refill:  5      I reviewed the patients updated PMH, FH, and SocHx.    Patient Active Problem List   Diagnosis Date Noted  . Psychophysiologic insomnia 04/11/2020  . 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 Compression fracture of thoracic vertebra with routine healing 08/07/2019  . Thoracogenic  scoliosis of thoracic region 07/16/2019  . Vitiligo 03/31/2018  . Major depression, recurrent, chronic (HCC) 12/12/2017  . Essential hypertension 12/12/2017  . GERD (gastroesophageal reflux disease) 12/12/2017  . History of diverticulitis 12/12/2017  . Osteoporosis 12/12/2017   Current Meds  Medication Sig  . diclofenac Sodium (VOLTAREN) 1 % GEL Apply 2 g topically 4 (four) times daily as needed (foot pain).  . DULoxetine (CYMBALTA) 20 MG capsule TAKE 1 CAPSULE BY MOUTH EVERY DAY  . metoprolol succinate (TOPROL-XL) 100 MG 24 hr tablet Take 1 tablet (100 mg total) by mouth daily.  . Multiple Vitamins-Minerals (ZINC PO) Take by mouth.   . pantoprazole (PROTONIX) 20 MG tablet TAKE 1 TABLET BY MOUTH EVERY DAY  . triamcinolone (KENALOG) 0.1 % APPLY 1 APPLICATION TOPICALLY 2 (TWO) TIMES DAILY. FOR 2 WEEKS, THEN AS NEEDED    Allergies: Patient has No Known Allergies. Family History: Patient family history includes Arthritis in her father and mother; Depression in her father and mother; Diabetes in her father and mother; Heart disease in her father and mother; Hypertension in her father and mother. Social History:  Patient  reports that she has never smoked. She has never used smokeless tobacco. She reports previous alcohol use. She reports that she does not use drugs.  Review of Systems: Constitutional: Negative  for fever malaise or anorexia Cardiovascular: negative for chest pain Respiratory: negative for SOB or persistent cough Gastrointestinal: negative for abdominal pain  Objective  Vitals: BP 134/80   Pulse 63   Temp 97.7 F (36.5 C) (Temporal)   Wt 160 lb 9.6 oz (72.8 kg)   SpO2 97%   BMI 29.37 kg/m  General: no acute distress , A&Ox3 Feet: Right mid foot with prominent tarsal bone.  No erythema or warmth.  No tenderness.  Normal heel.  Normal gait.  Has pain with walking in heels but able to walk on toes without pain.    Commons side effects, risks, benefits, and  alternatives for medications and treatment plan prescribed today were discussed, and the patient expressed understanding of the given instructions. Patient is instructed to call or message via MyChart if he/she has any questions or concerns regarding our treatment plan. No barriers to understanding were identified. We discussed Red Flag symptoms and signs in detail. Patient expressed understanding regarding what to do in case of urgent or emergency type symptoms.   Medication list was reconciled, printed and provided to the patient in AVS. Patient instructions and summary information was reviewed with the patient as documented in the AVS. This note was prepared with assistance of Dragon voice recognition software. Occasional wrong-word or sound-a-like substitutions may have occurred due to the inherent limitations of voice recognition software  This visit occurred during the SARS-CoV-2 public health emergency.  Safety protocols were in place, including screening questions prior to the visit, additional usage of staff PPE, and extensive cleaning of exam room while observing appropriate contact time as indicated for disinfecting solutions.

## 2020-12-11 ENCOUNTER — Other Ambulatory Visit: Payer: Self-pay

## 2020-12-11 ENCOUNTER — Ambulatory Visit (INDEPENDENT_AMBULATORY_CARE_PROVIDER_SITE_OTHER): Payer: Medicare HMO

## 2020-12-11 VITALS — BP 122/64 | HR 88 | Temp 97.9°F | Wt 163.8 lb

## 2020-12-11 DIAGNOSIS — Z0000 Encounter for general adult medical examination without abnormal findings: Secondary | ICD-10-CM | POA: Diagnosis not present

## 2020-12-11 NOTE — Patient Instructions (Addendum)
[name] , Thank you for taking time to come for your Medicare Wellness Visit. I appreciate your ongoing commitment to your health goals. Please review the following plan we discussed and let me know if I can assist you in the future.   Screening recommendations/referrals: Colonoscopy: No longer required  Mammogram: Done 01/29/20 Bone Density: Done 03/09/18 Recommended yearly ophthalmology/optometry visit for glaucoma screening and checkup Recommended yearly dental visit for hygiene and checkup  Vaccinations: Influenza vaccine: Up to date Pneumococcal vaccine: Up to date Tdap vaccine: not a candidate  Shingles vaccine: Shingrix discussed. Please contact your pharmacy for coverage information.   Covid-19:Completed 4/23 & 5//14/22  Advanced directives: Advance directive discussed with you today. Even though you declined this today please call our office should you change your mind and we can give you the proper paperwork for you to fill out.  Conditions/risks identified: none at this time  Next appointment: Follow up in one year for your annual wellness visit    Preventive Care 65 Years and Older, Female Preventive care refers to lifestyle choices and visits with your health care provider that can promote health and wellness. What does preventive care include?  A yearly physical exam. This is also called an annual well check.  Dental exams once or twice a year.  Routine eye exams. Ask your health care provider how often you should have your eyes checked.  Personal lifestyle choices, including:  Daily care of your teeth and gums.  Regular physical activity.  Eating a healthy diet.  Avoiding tobacco and drug use.  Limiting alcohol use.  Practicing safe sex.  Taking low-dose aspirin every day.  Taking vitamin and mineral supplements as recommended by your health care provider. What happens during an annual well check? The services and screenings done by your health  care provider during your annual well check will depend on your age, overall health, lifestyle risk factors, and family history of disease. Counseling  Your health care provider may ask you questions about your:  Alcohol use.  Tobacco use.  Drug use.  Emotional well-being.  Home and relationship well-being.  Sexual activity.  Eating habits.  History of falls.  Memory and ability to understand (cognition).  Work and work Astronomer.  Reproductive health. Screening  You may have the following tests or measurements:  Height, weight, and BMI.  Blood pressure.  Lipid and cholesterol levels. These may be checked every 5 years, or more frequently if you are over 23 years old.  Skin check.  Lung cancer screening. You may have this screening every year starting at age 61 if you have a 30-pack-year history of smoking and currently smoke or have quit within the past 15 years.  Fecal occult blood test (FOBT) of the stool. You may have this test every year starting at age 52.  Flexible sigmoidoscopy or colonoscopy. You may have a sigmoidoscopy every 5 years or a colonoscopy every 10 years starting at age 61.  Hepatitis C blood test.  Hepatitis B blood test.  Sexually transmitted disease (STD) testing.  Diabetes screening. This is done by checking your blood sugar (glucose) after you have not eaten for a while (fasting). You may have this done every 1-3 years.  Bone density scan. This is done to screen for osteoporosis. You may have this done starting at age 75.  Mammogram. This may be done every 1-2 years. Talk to your health care provider about how often you should have regular mammograms. Talk with your health care  provider about your test results, treatment options, and if necessary, the need for more tests. Vaccines  Your health care provider may recommend certain vaccines, such as:  Influenza vaccine. This is recommended every year.  Tetanus, diphtheria, and  acellular pertussis (Tdap, Td) vaccine. You may need a Td booster every 10 years.  Zoster vaccine. You may need this after age 48.  Pneumococcal 13-valent conjugate (PCV13) vaccine. One dose is recommended after age 25.  Pneumococcal polysaccharide (PPSV23) vaccine. One dose is recommended after age 28. Talk to your health care provider about which screenings and vaccines you need and how often you need them. This information is not intended to replace advice given to you by your health care provider. Make sure you discuss any questions you have with your health care provider. Document Released: 09/12/2015 Document Revised: 05/05/2016 Document Reviewed: 06/17/2015 Elsevier Interactive Patient Education  2017 [place] Prevention in the Home Falls can cause injuries. They can happen to people of all ages. There are many things you can do to make your home safe and to help prevent falls. What can I do on the outside of my home?  Regularly fix the edges of walkways and driveways and fix any cracks.  Remove anything that might make you trip as you walk through a door, such as a raised step or threshold.  Trim any bushes or trees on the path to your home.  Use bright outdoor lighting.  Clear any walking paths of anything that might make someone trip, such as rocks or tools.  Regularly check to see if handrails are loose or broken. Make sure that both sides of any steps have handrails.  Any raised decks and porches should have guardrails on the edges.  Have any leaves, snow, or ice cleared regularly.  Use sand or salt on walking paths during winter.  Clean up any spills in your garage right away. This includes oil or grease spills. What can I do in the bathroom?  Use night lights.  Install grab bars by the toilet and in the tub and shower. Do not use towel bars as grab bars.  Use non-skid mats or decals in the tub or shower.  If you need to sit down in the shower, use  a plastic, non-slip stool.  Keep the floor dry. Clean up any water that spills on the floor as soon as it happens.  Remove soap buildup in the tub or shower regularly.  Attach bath mats securely with double-sided non-slip rug tape.  Do not have throw rugs and other things on the floor that can make you trip. What can I do in the bedroom?  Use night lights.  Make sure that you have a light by your bed that is easy to reach.  Do not use any sheets or blankets that are too big for your bed. They should not hang down onto the floor.  Have a firm chair that has side arms. You can use this for support while you get dressed.  Do not have throw rugs and other things on the floor that can make you trip. What can I do in the kitchen?  Clean up any spills right away.  Avoid walking on wet floors.  Keep items that you use a lot in easy-to-reach places.  If you need to reach something above you, use a strong step stool that has a grab bar.  Keep electrical cords out of the way.  Do not use floor polish  or wax that makes floors slippery. If you must use wax, use non-skid floor wax.  Do not have throw rugs and other things on the floor that can make you trip. What can I do with my stairs?  Do not leave any items on the stairs.  Make sure that there are handrails on both sides of the stairs and use them. Fix handrails that are broken or loose. Make sure that handrails are as long as the stairways.  Check any carpeting to make sure that it is firmly attached to the stairs. Fix any carpet that is loose or worn.  Avoid having throw rugs at the top or bottom of the stairs. If you do have throw rugs, attach them to the floor with carpet tape.  Make sure that you have a light switch at the top of the stairs and the bottom of the stairs. If you do not have them, ask someone to add them for you. What else can I do to help prevent falls?  Wear shoes that:  Do not have high heels.  Have  rubber bottoms.  Are comfortable and fit you well.  Are closed at the toe. Do not wear sandals.  If you use a stepladder:  Make sure that it is fully opened. Do not climb a closed stepladder.  Make sure that both sides of the stepladder are locked into place.  Ask someone to hold it for you, if possible.  Clearly mark and make sure that you can see:  Any grab bars or handrails.  First and last steps.  Where the edge of each step is.  Use tools that help you move around (mobility aids) if they are needed. These include:  Canes.  Walkers.  Scooters.  Crutches.  Turn on the lights when you go into a dark area. Replace any light bulbs as soon as they burn out.  Set up your furniture so you have a clear path. Avoid moving your furniture around.  If any of your floors are uneven, fix them.  If there are any pets around you, be aware of where they are.  Review your medicines with your doctor. Some medicines can make you feel dizzy. This can increase your chance of falling. Ask your doctor what other things that you can do to help prevent falls. This information is not intended to replace advice given to you by your health care provider. Make sure you discuss any questions you have with your health care provider. Document Released: 06/12/2009 Document Revised: 01/22/2016 Document Reviewed: 09/20/2014 Elsevier Interactive Patient Education  2017 [employer].

## 2020-12-11 NOTE — Progress Notes (Signed)
Subjective:   Veronica Myers is a 80 y.o. female who presents for Medicare Annual (Subsequent) preventive examination.  Review of Systems     Cardiac Risk Factors include: advanced age (>[id number], 62>65 women);hypertension     Objective:    Today's Vitals   12/11/20 1328  BP: 122/64  Pulse: 88  Temp: 97.9 F (36.6 C)  SpO2: 94%  Weight: 163 lb 12.8 oz (74.3 kg)   Body mass index is 29.96 kg/m.  Advanced Directives 12/11/2020 09/10/2019 07/20/2019 07/18/2019 06/24/2019  Does Patient Have a Medical Advance Directive? No No No No No  Would patient like information on creating a medical advance directive? No - Patient declined No - Patient declined No - Patient declined No - Guardian declined Yes (ED - Information included in AVS)    Current Medications (verified) Outpatient Encounter Medications as of 12/11/2020  Medication Sig  . diclofenac Sodium (VOLTAREN) 1 % GEL Apply 2 g topically 4 (four) times daily as needed (foot pain).  . DULoxetine (CYMBALTA) 20 MG capsule TAKE 1 CAPSULE BY MOUTH EVERY DAY  . metoprolol succinate (TOPROL-XL) 100 MG 24 hr tablet Take 1 tablet (100 mg total) by mouth daily.  . Multiple Vitamins-Minerals (ZINC PO) Take by mouth.   . pantoprazole (PROTONIX) 20 MG tablet TAKE 1 TABLET BY MOUTH EVERY DAY  . triamcinolone (KENALOG) 0.1 % APPLY 1 APPLICATION TOPICALLY 2 (TWO) TIMES DAILY. FOR 2 WEEKS, THEN AS NEEDED (Patient not taking: Reported on 12/11/2020)  . [DISCONTINUED] Omega-3 Krill Oil 500 MG CAPS Take by mouth.  (Patient not taking: No sig reported)  . [DISCONTINUED] rosuvastatin (CRESTOR) 10 MG tablet Take 1 tablet (10 mg total) by mouth daily. (Patient not taking: No sig reported)   No facility-administered encounter medications on file as of 12/11/2020.    Allergies (verified) Patient has no known allergies.   History: Past Medical History:  Diagnosis Date  . Arthritis   . 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 Depression   .  Diverticulitis   . GERD (gastroesophageal reflux disease)   . Glaucoma   . Hypertension   . Osteoporosis 12/12/2017   From old medical records. No dexa report attached. Had been on fosamax.   . Vitiligo 03/31/2018   History reviewed. No pertinent surgical history. Family History  Problem Relation Age of Onset  . Arthritis Mother   . Depression Mother   . Diabetes Mother   . Heart disease Mother   . Hypertension Mother   . Arthritis Father   . Depression Father   . Diabetes Father   . Heart disease Father   . Hypertension Father    Social History   Socioeconomic History  . Marital status: Widowed    Spouse name: Not on file  . Number of children: 3  . Years of education: Not on file  . Highest education level: Not on file  Occupational History  . Not on file  Tobacco Use  . Smoking status: Never Smoker  . Smokeless tobacco: Never Used  Vaping Use  . Vaping Use: Never used  Substance and Sexual Activity  . Alcohol use: Not Currently  . Drug use: Never  . Sexual activity: Not Currently  Other Topics Concern  . Not on file  Social History Narrative  . Not on file   Social Determinants of Health   Financial Resource Strain: Low Risk   . Difficulty of Paying Living Expenses: Not hard at all  Food Insecurity: No Food Insecurity  .  Worried About Programme researcher, broadcasting/film/video in the Last Year: Never true  . Ran Out of Food in the Last Year: Never true  Transportation Needs: No Transportation Needs  . Lack of Transportation (Medical): No  . Lack of Transportation (Non-Medical): No  Physical Activity: Insufficiently Active  . Days of Exercise per Week: 5 days  . Minutes of Exercise per Session: 10 min  Stress: No Stress Concern Present  . Feeling of Stress : Not at all  Social Connections: Socially Isolated  . Frequency of Communication with Friends and Family: More than three times a week  . Frequency of Social Gatherings with Friends and Family: More than three times a week   . Attends Religious Services: Never  . Active Member of Clubs or Organizations: No  . Attends Banker Meetings: Never  . Marital Status: Widowed    Tobacco Counseling Counseling given: Not Answered   Clinical Intake:  Pre-visit preparation completed: Yes  Pain : No/denies pain     BMI - recorded: 29.96 Nutritional Status: BMI 25 -29 Overweight Nutritional Risks: None Diabetes: No  How often do you need to have someone help you when you read instructions, pamphlets, or other written materials from your doctor or pharmacy?: 1 - Never  Diabetic?No  Interpreter Needed?: No  Information entered by :: Lanier Ensign, LPN   Activities of Daily Living In your present state of health, do you have any difficulty performing the following activities: 12/11/2020 09/11/2020  Hearing? N N  Vision? N N  Difficulty concentrating or making decisions? N N  Walking or climbing stairs? N N  Dressing or bathing? N N  Doing errands, shopping? N N  Preparing Food and eating ? N -  Using the Toilet? N -  In the past six months, have you accidently leaked urine? N -  Do you have problems with loss of bowel control? N -  Managing your Medications? N -  Managing your Finances? N -  Housekeeping or managing your Housekeeping? N -  Some recent data might be hidden    Patient Care Team: Willow Ora, MD as PCP - General (Family Medicine) Rodolph Bong, MD as Consulting Physician (Sports Medicine)  Indicate any recent Medical Services you may have received from other than [facility] providers in the past year (date may be approximate).     Assessment:   This is a routine wellness examination for [facility].  Hearing/Vision screen  Hearing Screening   125Hz  250Hz  500Hz  1000Hz  2000Hz  3000Hz  4000Hz  6000Hz  8000Hz   Right ear:           Left ear:           Comments: Pt denies any hearing issues   Vision Screening Comments: Pt follows up with [facility] for annual eye  exams   Dietary issues and exercise activities discussed: Current Exercise Habits: Home exercise routine, Type of exercise: walking;stretching, Time (Minutes): 20, Frequency (Times/Week): 5, Weekly Exercise (Minutes/Week): 100  Goals    . Patient Stated     None at this time      Depression Screen PHQ 2/9 Scores 12/11/2020 09/11/2020 10/22/2019 09/10/2019 06/29/2018 01/24/2018 12/12/2017  PHQ - 2 Score 0 0 0 5 6 2 5   PHQ- 9 Score - - 0 16 27 5  -    Fall Risk Fall Risk  12/11/2020 09/11/2020 09/10/2019 01/24/2018  Falls in the past year? 0 0 0 Yes  Number falls in past yr: 0 0 - 2 or  more  Injury with Fall? 0 0 0 Yes  Comment - - - Broke left arm  Follow up Falls prevention discussed - Falls evaluation completed;Education provided;Falls prevention discussed -    FALL RISK PREVENTION PERTAINING TO THE HOME:  Any stairs in or around the home? Yes  If so, are there any without handrails? No  Home free of loose throw rugs in walkways, pet beds, electrical cords, etc? Yes  Adequate lighting in your home to reduce risk of falls? Yes   ASSISTIVE DEVICES UTILIZED TO PREVENT FALLS:  Life alert? No  Use of a cane, walker or w/c? No  Grab bars in the bathroom? No  Shower chair or bench in shower? No  Elevated toilet seat or a handicapped toilet? No   TIMED UP AND GO:  Was the test performed? Yes .  Length of time to ambulate 10 feet: 10 sec.   Gait steady and fast without use of assistive device  Cognitive Function:     6CIT Screen 12/11/2020  What Year? 0 points  What month? 0 points  Count back from 20 0 points  Months in reverse 0 points  Repeat phrase 0 points    Immunizations Immunization History  Administered Date(s) Administered  . Fluad Quad(high Dose 65+) 07/16/2019  . Influenza, High Dose Seasonal PF 07/04/2020  . PFIZER(Purple Top)SARS-COV-2 Vaccination 12/21/2019, 01/11/2020  . Pneumococcal Conjugate-13 03/30/2018  . Pneumococcal Polysaccharide-23 07/16/2019  .  Zoster Recombinat (Shingrix) 03/30/2018    Not a candidate for Tetanus  Flu Vaccine status: Up to date  Pneumococcal vaccine status: Up to date  Covid-19 vaccine status: Completed vaccines  Qualifies for Shingles Vaccine? Yes   Zostavax completed Yes   Shingrix Completed?: No.    Education has been provided regarding the importance of this vaccine. Patient has been advised to call insurance company to determine out of pocket expense if they have not yet received this vaccine. Advised may also receive vaccine at local pharmacy or Health Dept. Verbalized acceptance and understanding.  Screening Tests Health Maintenance  Topic Date Due  . COVID-19 Vaccine (3 - Booster for Pfizer series) 07/13/2020  . MAMMOGRAM  01/28/2021  . DEXA SCAN  03/09/2021  . INFLUENZA VACCINE  03/30/2021  . PNA vac Low Risk Adult  Completed  . HPV VACCINES  Aged Out  . Hepatitis C Screening  Discontinued    Health Maintenance  Health Maintenance Due  Topic Date Due  . COVID-19 Vaccine (3 - Booster for Pfizer series) 07/13/2020    Colorectal cancer screening: No longer required.   Mammogram status: Completed 01/29/20. Repeat every year  Bone Density status: Completed 03/09/18. Results reflect: Bone density results: OSTEOPOROSIS. Repeat every 3 years.  Additional Screening:  Vision Screening: Recommended annual ophthalmology exams for early detection of glaucoma and other disorders of the eye. Is the patient up to date with their annual eye exam?  Yes  Who is the provider or what is the name of the office in which the patient attends annual eye exams? [facility]  If pt is not established with a provider, would they like to be referred to a provider to establish care? No .   Dental Screening: Recommended annual dental exams for proper oral hygiene  Community Resource Referral / Chronic Care Management: CRR required this visit?  No   CCM required this visit?  No      Plan:     I have  personally reviewed and noted the following in the  patient's chart:   . Medical and social history . Use of alcohol, tobacco or illicit drugs  . Current medications and supplements . Functional ability and status . Nutritional status . Physical activity . Advanced directives . List of other physicians . Hospitalizations, surgeries, and ER visits in previous 12 months . Vitals . Screenings to include cognitive, depression, and falls . Referrals and appointments  In addition, I have reviewed and discussed with patient certain preventive protocols, quality metrics, and best practice recommendations. A written personalized care plan for preventive services as well as general preventive health recommendations were provided to patient.     Marzella Schlein, LPN   11/26/760   Nurse Notes: None

## 2021-01-05 ENCOUNTER — Telehealth: Payer: Self-pay

## 2021-01-05 NOTE — Telephone Encounter (Signed)
Patient is calling in stating she is wanting to know the next steps with her foot pain, she said it isnt getting any better. Wondering if she needs a referral to a specialist.

## 2021-01-05 NOTE — Telephone Encounter (Signed)
FYI

## 2021-01-06 ENCOUNTER — Other Ambulatory Visit: Payer: Self-pay

## 2021-01-06 DIAGNOSIS — M79671 Pain in right foot: Secondary | ICD-10-CM

## 2021-01-06 NOTE — Telephone Encounter (Signed)
Please notify patient: refer to sports medicine for further evaluation, mid foot pain. Thanks.

## 2021-01-06 NOTE — Telephone Encounter (Signed)
Spoke with patient, order for sports medicine has been placed

## 2021-03-13 ENCOUNTER — Ambulatory Visit: Payer: Medicare HMO | Admitting: Family Medicine

## 2021-04-01 ENCOUNTER — Other Ambulatory Visit: Payer: Self-pay | Admitting: Family Medicine

## 2021-04-08 ENCOUNTER — Other Ambulatory Visit: Payer: Self-pay | Admitting: Family Medicine

## 2021-04-08 ENCOUNTER — Encounter: Payer: Self-pay | Admitting: Family Medicine

## 2021-04-08 ENCOUNTER — Other Ambulatory Visit: Payer: Self-pay

## 2021-04-08 ENCOUNTER — Ambulatory Visit (INDEPENDENT_AMBULATORY_CARE_PROVIDER_SITE_OTHER): Payer: Medicare HMO | Admitting: Family Medicine

## 2021-04-08 VITALS — BP 132/80 | HR 80 | Temp 97.3°F | Wt 162.8 lb

## 2021-04-08 DIAGNOSIS — R350 Frequency of micturition: Secondary | ICD-10-CM | POA: Diagnosis not present

## 2021-04-08 DIAGNOSIS — I10 Essential (primary) hypertension: Secondary | ICD-10-CM

## 2021-04-08 DIAGNOSIS — F5104 Psychophysiologic insomnia: Secondary | ICD-10-CM

## 2021-04-08 DIAGNOSIS — K219 Gastro-esophageal reflux disease without esophagitis: Secondary | ICD-10-CM | POA: Diagnosis not present

## 2021-04-08 DIAGNOSIS — F339 Major depressive disorder, recurrent, unspecified: Secondary | ICD-10-CM | POA: Diagnosis not present

## 2021-04-08 MED ORDER — DULOXETINE HCL 20 MG PO CPEP: ORAL_CAPSULE | ORAL | 0 refills | Status: DC

## 2021-04-08 MED ORDER — PANTOPRAZOLE SODIUM 20 MG PO TBEC: 20.0000 mg | DELAYED_RELEASE_TABLET | Freq: Every day | ORAL | 3 refills | Status: DC

## 2021-04-08 NOTE — Patient Instructions (Signed)
Please return in January for your annual complete physical; please come fasting.   Please restart the pantoprazole daily for your heart burn. I have reordered it.  Please take your cymbalta in the early evening.  Continue your metoprolol once a day.   We will notify you if your urine test returns positive for infection.   If you have any questions or concerns, please don't hesitate to send me a message via MyChart or call the office at [phone]. Thank you for visiting with Veronica Myers today! It's our pleasure caring for you.

## 2021-04-08 NOTE — Progress Notes (Signed)
Subjective  CC:  Chief Complaint  Patient presents with   Hypertension    Started experiencing heartburn over the past week, so she bought OTC Nexium. Also began experiencing dysuria, so she started taking AZO. States that BP has been elevated, 150's-160's/80's at home. Admitted that she has doubled up on her Metoprolol and Cymbalta dosage over the past week.      HPI: Veronica Myers is a 80 y.o. female who presents to the office today to address the problems listed above in the chief complaint. Hypertension f/u: Control is good . Pt reports she is doing well. taking medications as instructed, no medication side effects noted, no TIAs, no chest pain on exertion, no dyspnea on exertion, no swelling of ankles. She denies adverse effects from his BP medications. Compliance with medication is good. She has anxiety htn response; she has checked a few home readings that got higher as she checked again.  Urinary frequency w/o dysuria or gross hematuria. No f/c/s or pain.  Depression: reports mood is over all good. Has an occasional bad day and took an extra dose at night to help her sleep. Has insomnia.  Gerd: active again. Ran out of protonix and did not refill it. Has chronic gerd. Had been well controlled at last visit on protonix   Assessment  1. Essential hypertension   2. Urinary frequency   3. Major depression, recurrent, chronic (HCC)   4. Gastroesophageal reflux disease, unspecified whether esophagitis present   5. Psychophysiologic insomnia      Plan   Hypertension f/u: BP control is well controlled. Resssured. Do not rec home monitoring due to her anxiety response. Continue toprol xl 100 daily. GERD f/u: restart protonix 20 daily. Educated.  Depression: wel lcontrolled. Avoid taking xtra pills. Educsated. Insomina: behavioral mgt Check ua to r/o uti.   Education regarding management of these chronic disease states was given. Management strategies discussed on successive  visits include dietary and exercise recommendations, goals of achieving and maintaining IBW, and lifestyle modifications aiming for adequate sleep and minimizing stressors.   Follow up:  Outpatient Encounter Medications as of 04/08/2021  Medication Sig   diclofenac Sodium (VOLTAREN) 1 % GEL Apply 2 g topically 4 (four) times daily as needed (foot pain).   metoprolol succinate (TOPROL-XL) 100 MG 24 hr tablet TAKE 1 TABLET BY MOUTH EVERY DAY   Multiple Vitamins-Minerals (ZINC PO) Take by mouth.    triamcinolone (KENALOG) 0.1 % APPLY 1 APPLICATION TOPICALLY 2 (TWO) TIMES DAILY. FOR 2 WEEKS, THEN AS NEEDED   [DISCONTINUED] DULoxetine (CYMBALTA) 20 MG capsule TAKE 1 CAPSULE BY MOUTH EVERY DAY   [DISCONTINUED] pantoprazole (PROTONIX) 20 MG tablet TAKE 1 TABLET BY MOUTH EVERY DAY   DULoxetine (CYMBALTA) 20 MG capsule TAKE 1 CAPSULE BY MOUTH EVERY DAY   pantoprazole (PROTONIX) 20 MG tablet Take 1 tablet (20 mg total) by mouth daily.   No facility-administered encounter medications on file as of 04/08/2021.    Orders Placed This Encounter  Procedures   Urine Culture   Urinalysis, Routine w reflex microscopic   Meds ordered this encounter  Medications   DULoxetine (CYMBALTA) 20 MG capsule    Sig: TAKE 1 CAPSULE BY MOUTH EVERY DAY    Dispense:  15 capsule    Refill:  0    Please fill short term refill: pt has run out too soon because she had self adjusted the dose.   pantoprazole (PROTONIX) 20 MG tablet    Sig: Take 1 tablet (  20 mg total) by mouth daily.    Dispense:  90 tablet    Refill:  3      BP Readings from Last 3 Encounters:  04/08/21 132/80  12/11/20 122/64  12/08/20 134/80   Wt Readings from Last 3 Encounters:  04/08/21 162 lb 12.8 oz (73.8 kg)  12/11/20 163 lb 12.8 oz (74.3 kg)  12/08/20 160 lb 9.6 oz (72.8 kg)    Lab Results  Component Value Date   CHOL 188 09/11/2020   CHOL 194 09/10/2019   CHOL 167 12/12/2017   Lab Results  Component Value Date   HDL 55.90  09/11/2020   HDL 64.90 09/10/2019   HDL 56.00 12/12/2017   Lab Results  Component Value Date   LDLCALC 108 (H) 09/11/2020   LDLCALC 100 (H) 09/10/2019   LDLCALC 92 12/12/2017   Lab Results  Component Value Date   TRIG 118.0 09/11/2020   TRIG 146.0 09/10/2019   TRIG 97.0 12/12/2017   Lab Results  Component Value Date   CHOLHDL 3 09/11/2020   CHOLHDL 3 09/10/2019   CHOLHDL 3 12/12/2017   No results found for: LDLDIRECT Lab Results  Component Value Date   CREATININE 0.84 09/11/2020   BUN 10 09/11/2020   NA 137 09/11/2020   K 4.3 09/11/2020   CL 102 09/11/2020   CO2 30 09/11/2020    The 10-year ASCVD risk score Denman George DC Jr., et al., 2013) is: 29.9%   Values used to calculate the score:     Age: 61 years     Sex: Female     Is Non-Hispanic African American: No     Diabetic: No     Tobacco smoker: No     Systolic Blood Pressure: 132 mmHg     Is BP treated: Yes     HDL Cholesterol: 55.9 mg/dL     Total Cholesterol: 188 mg/dL  I reviewed the patients updated PMH, FH, and SocHx.    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  Social History: Patient  reports that she has never smoked. She has never used smokeless tobacco. She reports that she does not currently use alcohol. She reports that she does not use drugs.  Current Meds  Medication Sig   diclofenac Sodium (VOLTAREN) 1 % GEL Apply 2 g topically 4 (four) times daily as needed (foot pain).    metoprolol succinate (TOPROL-XL) 100 MG 24 hr tablet TAKE 1 TABLET BY MOUTH EVERY DAY   Multiple Vitamins-Minerals (ZINC PO) Take by mouth.    triamcinolone (KENALOG) 0.1 % APPLY 1 APPLICATION TOPICALLY 2 (TWO) TIMES DAILY. FOR 2 WEEKS, THEN AS NEEDED   [DISCONTINUED] DULoxetine (CYMBALTA) 20 MG capsule TAKE 1 CAPSULE BY MOUTH EVERY DAY   [DISCONTINUED] pantoprazole (PROTONIX) 20 MG tablet TAKE 1 TABLET BY MOUTH EVERY DAY    Review of Systems: Cardiovascular: negative for chest pain, palpitations, leg swelling, orthopnea Respiratory: negative for SOB, wheezing or persistent cough Gastrointestinal: negative for abdominal pain Genitourinary: negative for dysuria or gross hematuria  Objective  Vitals: BP 132/80   Pulse 80   Temp (!) 97.3 F (36.3 C) (Temporal)   Wt 162 lb 12.8 oz (73.8 kg)   SpO2 98%   BMI 29.78 kg/m  General: no acute distress  Psych:  Alert and oriented, normal mood and affect HEENT:  Normocephalic, atraumatic, supple neck  Cardiovascular:  RRR without murmur. no edema Respiratory:  Good breath sounds bilaterally, CTAB with normal respiratory effort Skin:  Warm, no rashes Neurologic:   Mental status is normal  Office Visit on 04/08/2021  Component Date Value Ref Range Status   Color, Urine 04/08/2021 YELLOW  Yellow;Lt. Yellow;Straw;Dark Yellow;Amber;Green;Red;Brown Final   APPearance 04/08/2021 CLEAR  Clear;Turbid;Slightly Cloudy;Cloudy Final   Specific Gravity, Urine 04/08/2021 1.020  1.000 - 1.030 Final   pH 04/08/2021 6.0  5.0 - 8.0 Final   Total Protein, Urine 04/08/2021 NEGATIVE  Negative Final   Urine Glucose 04/08/2021 NEGATIVE  Negative Final   Ketones, ur 04/08/2021 15 (A) Negative Final   Bilirubin Urine 04/08/2021 NEGATIVE  Negative Final   Hgb urine dipstick 04/08/2021 NEGATIVE  Negative Final   Urobilinogen, UA 04/08/2021 0.2  0.0 - 1.0 Final   Leukocytes,Ua 04/08/2021 NEGATIVE  Negative Final   Nitrite 04/08/2021 NEGATIVE  Negative Final    WBC, UA 04/08/2021 3-6/hpf (A) 0-2/hpf Final   RBC / HPF 04/08/2021 0-2/hpf  0-2/hpf Final   Squamous Epithelial / LPF 04/08/2021 Rare(0-4/hpf)  Rare(0-4/hpf) Final   Ca Oxalate Crys, UA 04/08/2021 Presence of (A) None Final   MICRO NUMBER: 04/08/2021 [id number]   Final   SPECIMEN QUALITY: 04/08/2021 Adequate   Final   Sample Source 04/08/2021 NOT GIVEN   Final   STATUS: 04/08/2021 FINAL   Final   ISOLATE 1: 04/08/2021    Final                   Value:Mixed genital flora isolated. These superficial bacteria are not indicative of a urinary tract infection. No further organism identification is warranted on this specimen. If clinically indicated, recollect clean-catch, mid-stream urine and transfer  immediately to Urine Culture Transport Tube.     Commons side effects, risks, benefits, and alternatives for medications and treatment plan prescribed today were discussed, and the patient expressed understanding of the given instructions. Patient is instructed to call or message via MyChart if he/she has any questions or concerns regarding our treatment plan. No barriers to understanding were identified. We discussed Red Flag symptoms and signs in detail. Patient expressed understanding regarding what to do in case of urgent or emergency type symptoms.  Medication list was reconciled, printed and provided to the patient in AVS. Patient instructions and summary information was reviewed with the patient as documented in the AVS. This note was prepared with assistance of Dragon voice recognition software. Occasional wrong-word or sound-a-like substitutions may have occurred due to the inherent limitations of voice recognition software  This visit occurred during the SARS-CoV-2 public health emergency.  Safety protocols were in place, including screening questions prior to the visit, additional usage of staff PPE, and extensive cleaning of exam room while observing appropriate contact time as indicated for  disinfecting solutions.

## 2021-04-09 LAB — URINALYSIS, ROUTINE W REFLEX MICROSCOPIC
Bilirubin Urine: NEGATIVE
Hgb urine dipstick: NEGATIVE
Ketones, ur: 15 — AB
Leukocytes,Ua: NEGATIVE
Nitrite: NEGATIVE
Specific Gravity, Urine: 1.02 (ref 1.000–1.030)
Total Protein, Urine: NEGATIVE
Urine Glucose: NEGATIVE
Urobilinogen, UA: 0.2 (ref 0.0–1.0)
pH: 6 (ref 5.0–8.0)

## 2021-04-09 LAB — URINE CULTURE
MICRO NUMBER:: 12225466
SPECIMEN QUALITY:: ADEQUATE

## 2021-05-06 ENCOUNTER — Ambulatory Visit: Payer: Medicare HMO | Admitting: Family Medicine

## 2021-05-26 ENCOUNTER — Other Ambulatory Visit: Payer: Self-pay | Admitting: Family Medicine

## 2021-05-26 ENCOUNTER — Ambulatory Visit: Payer: Medicare HMO

## 2021-05-26 ENCOUNTER — Telehealth: Payer: Self-pay

## 2021-05-26 NOTE — Telephone Encounter (Signed)
.   Encourage patient to contact the pharmacy for refills or they can request refills through [facility]  LAST APPOINTMENT DATE:  Please schedule appointment if longer than 1 year  NEXT APPOINTMENT DATE:  MEDICATION:DULoxetine (CYMBALTA) 20 MG capsule  Is the patient out of medication? YESS  PHARMACY:[id number] - [place], [place] - [address]  Let patient know to contact pharmacy at the end of the day to make sure medication is ready.  Please notify patient to allow 48-72 hours to process  CLINICAL FILLS OUT ALL BELOW:   LAST REFILL:  QTY:  REFILL DATE:    OTHER COMMENTS:    Okay for refill?  Please advise

## 2021-05-27 NOTE — Telephone Encounter (Signed)
Medication has been filled 

## 2021-05-28 ENCOUNTER — Other Ambulatory Visit: Payer: Self-pay

## 2021-05-28 MED ORDER — DULOXETINE HCL 20 MG PO CPEP: 20.0000 mg | ORAL_CAPSULE | Freq: Every day | ORAL | 3 refills | Status: DC

## 2021-06-09 ENCOUNTER — Ambulatory Visit (INDEPENDENT_AMBULATORY_CARE_PROVIDER_SITE_OTHER): Payer: Medicare HMO | Admitting: Family Medicine

## 2021-06-09 ENCOUNTER — Other Ambulatory Visit: Payer: Self-pay

## 2021-06-09 ENCOUNTER — Telehealth: Payer: Self-pay

## 2021-06-09 ENCOUNTER — Encounter: Payer: Self-pay | Admitting: Family Medicine

## 2021-06-09 VITALS — BP 140/86 | HR 69 | Temp 97.4°F | Ht 62.0 in | Wt 166.6 lb

## 2021-06-09 DIAGNOSIS — H6521 Chronic serous otitis media, right ear: Secondary | ICD-10-CM | POA: Diagnosis not present

## 2021-06-09 DIAGNOSIS — Z23 Encounter for immunization: Secondary | ICD-10-CM

## 2021-06-09 DIAGNOSIS — L219 Seborrheic dermatitis, unspecified: Secondary | ICD-10-CM | POA: Diagnosis not present

## 2021-06-09 DIAGNOSIS — I10 Essential (primary) hypertension: Secondary | ICD-10-CM

## 2021-06-09 MED ORDER — SHINGRIX 50 MCG/0.5ML IM SUSR: 0.5000 mL | Freq: Once | INTRAMUSCULAR | 0 refills | Status: AC

## 2021-06-09 MED ORDER — AMOXICILLIN 875 MG PO TABS: 875.0000 mg | ORAL_TABLET | Freq: Two times a day (BID) | ORAL | 0 refills | Status: DC

## 2021-06-09 MED ORDER — TRIAMCINOLONE ACETONIDE 0.1 % EX LOTN: 1.0000 "application " | TOPICAL_LOTION | Freq: Two times a day (BID) | CUTANEOUS | 2 refills | Status: DC | PRN

## 2021-06-09 NOTE — Telephone Encounter (Signed)
Pt called stating that she saw Dr Mardelle Matte today and Dr Mardelle Matte prescribed Amoxicillin. Veronica Myers stated that she is not able to take Amoxicillin because she had a bad experience with it the last time she took it. Can Dr Mardelle Matte prescribe anything else? Please Advise.

## 2021-06-09 NOTE — Progress Notes (Signed)
Subjective  CC:  Chief Complaint  Patient presents with   Ear Fullness    Wants an ENT referral, ongoing for a month right ear only has had some bleeding    HPI: Veronica Myers is a 80 y.o. female who presents to the office today to address the problems listed above in the chief complaint. 80 yo c/o ear problems for mnths and months; c/o ear wax but now with pressure and pain when sleeping on the right side. No f/c or cold sxs. No trauma;however, feels like something is in the ear; has used q tips: wax and blood at times. Wants to see ENT Rash: base of neck at hairline and around ears is back. Flaking and at times itches.  HTN: home readings are ok. Checked yesterday. Feeling well. Taking medications w/o adverse effects. No symptoms of CHF, angina; no palpitations, sob, cp or lower extremity edema. Compliant with meds.    Assessment  1. Right chronic serous otitis media   2. Seborrheic dermatitis   3. Essential hypertension      Plan  Abnl TM:  amox and refer to ENT. Avoid q tips.  Seb derm: tac lotion bid prn. Education given HTN: fairly well controlled. Has white coat componenet. Continue bb.   Follow up: jan for cpe  Visit date not found  Orders Placed This Encounter  Procedures   Ambulatory referral to ENT   Meds ordered this encounter  Medications   Zoster Vaccine Adjuvanted [facility]) injection    Sig: Inject 0.5 mLs into the muscle once for 1 dose.    Dispense:  1 each    Refill:  0    Pt had her 1st dose in 2019.   triamcinolone lotion (KENALOG) 0.1 %    Sig: Apply 1 application topically 2 (two) times daily as needed (for seborrhea dermatitis (scalp rash)).    Dispense:  60 mL    Refill:  2   amoxicillin (AMOXIL) 875 MG tablet    Sig: Take 1 tablet (875 mg total) by mouth 2 (two) times daily for 10 days.    Dispense:  20 tablet    Refill:  0      I reviewed the patients updated PMH, FH, and SocHx.    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   DULoxetine (CYMBALTA) 20 MG capsule Take 1 capsule (20 mg total) by mouth daily.   metoprolol succinate (TOPROL-XL) 100 MG 24 hr tablet TAKE 1 TABLET BY MOUTH EVERY DAY   Multiple Vitamins-Minerals (ZINC PO) Take by mouth.    triamcinolone lotion (KENALOG) 0.1 % Apply 1 application topically 2 (two) times daily as needed (for seborrhea dermatitis (scalp rash)).   Zoster Vaccine Adjuvanted [facility]) injection Inject 0.5 mLs into the muscle once for 1 dose.    Allergies: Patient has No Known Allergies. Family History: Patient family history includes Arthritis in her father and mother; Depression in  her father and mother; Diabetes in her father and mother; Heart disease in her father and mother; Hypertension in her father and mother. Social History:  Patient  reports that she has never smoked. She has never used smokeless tobacco. She reports that she does not currently use alcohol. She reports that she does not use drugs.  Review of Systems: Constitutional: Negative for fever malaise or anorexia Cardiovascular: negative for chest pain Respiratory: negative for SOB or persistent cough Gastrointestinal: negative for abdominal pain  Objective  Vitals: BP (!) 160/96   Pulse 69   Temp (!) 97.4 F (36.3 C) (Temporal)   Ht 5\' 2"  (1.575 m)   Wt 166 lb 9.6 oz (75.6 kg)   SpO2 94%   BMI  30.47 kg/m  General: no acute distress , A&Ox3 HEENT: PEERL, conjunctiva normal, neck is supple, right TM: air fluid level, opaque membrane. Left Tm nl. Cardiovascular:  RRR without murmur or gallop.  Respiratory:  Good breath sounds bilaterally, CTAB with normal respiratory effort Skin:  Warm, erythematous rash with light flaking at nape of neck    Commons side effects, risks, benefits, and alternatives for medications and treatment plan prescribed today were discussed, and the patient expressed understanding of the given instructions. Patient is instructed to call or message via MyChart if he/she has any questions or concerns regarding our treatment plan. No barriers to understanding were identified. We discussed Red Flag symptoms and signs in detail. Patient expressed understanding regarding what to do in case of urgent or emergency type symptoms.  Medication list was reconciled, printed and provided to the patient in AVS. Patient instructions and summary information was reviewed with the patient as documented in the AVS. This note was prepared with assistance of Dragon voice recognition software. Occasional wrong-word or sound-a-like substitutions may have occurred due to the inherent limitations of voice recognition software  This visit occurred during the SARS-CoV-2 public health emergency.  Safety protocols were in place, including screening questions prior to the visit, additional usage of staff PPE, and extensive cleaning of exam room while observing appropriate contact time as indicated for disinfecting solutions.

## 2021-06-09 NOTE — Telephone Encounter (Signed)
Please advise 

## 2021-06-09 NOTE — Patient Instructions (Signed)
Please return in January for your annual complete physical; please come fasting.   We will call you with an appointment to see ENT. Take the amoxicillin in the meantime.   Use the steroid lotion on your scalp/hair line for red dry rash as needed. Can also be used around the outside of your ears if needed.   We gave you your flu shot today. Please take the prescription for Shingrix to the pharmacy so they may administer the vaccination. Your insurance will then cover the injections.    If you have any questions or concerns, please don't hesitate to send me a message via MyChart or call the office at [phone]. Thank you for visiting with [place] today! It's our pleasure caring for you.

## 2021-06-10 MED ORDER — SULFAMETHOXAZOLE-TRIMETHOPRIM 800-160 MG PO TABS: 1.0000 | ORAL_TABLET | Freq: Two times a day (BID) | ORAL | 0 refills | Status: DC

## 2021-06-10 NOTE — Telephone Encounter (Signed)
Please let her know I changed the antibiotic to septra ds twice a day for a week

## 2021-06-10 NOTE — Telephone Encounter (Signed)
Spoke with patient, gave a verbal understanding °

## 2021-06-16 ENCOUNTER — Telehealth: Payer: Self-pay

## 2021-06-16 NOTE — Telephone Encounter (Signed)
Pt called regarding her ear. She stated that she saw Dr Mardelle Matte on 10/11. Veronica Myers stated that the bleeding stopped in her ear but she still feels like there is something in her ear. I gave pt the phone number to Dr Avel Sensor office. Please Advise.

## 2021-06-16 NOTE — Telephone Encounter (Signed)
Please advise 

## 2021-06-18 NOTE — Telephone Encounter (Signed)
Spoke with patient gave a verbal understanding 

## 2021-08-10 DIAGNOSIS — H9011 Conductive hearing loss, unilateral, right ear, with unrestricted hearing on the contralateral side: Secondary | ICD-10-CM | POA: Diagnosis not present

## 2021-08-10 DIAGNOSIS — H6121 Impacted cerumen, right ear: Secondary | ICD-10-CM | POA: Diagnosis not present

## 2021-09-15 ENCOUNTER — Encounter: Payer: Medicare HMO | Admitting: Family Medicine

## 2021-09-29 ENCOUNTER — Ambulatory Visit (INDEPENDENT_AMBULATORY_CARE_PROVIDER_SITE_OTHER): Payer: Medicare HMO | Admitting: Family Medicine

## 2021-09-29 ENCOUNTER — Other Ambulatory Visit: Payer: Self-pay

## 2021-09-29 VITALS — BP 145/77 | HR 61 | Temp 97.3°F | Ht 62.0 in | Wt 165.4 lb

## 2021-09-29 DIAGNOSIS — I10 Essential (primary) hypertension: Secondary | ICD-10-CM | POA: Diagnosis not present

## 2021-09-29 DIAGNOSIS — M810 Age-related osteoporosis without current pathological fracture: Secondary | ICD-10-CM | POA: Diagnosis not present

## 2021-09-29 DIAGNOSIS — F339 Major depressive disorder, recurrent, unspecified: Secondary | ICD-10-CM | POA: Diagnosis not present

## 2021-09-29 DIAGNOSIS — F5104 Psychophysiologic insomnia: Secondary | ICD-10-CM | POA: Diagnosis not present

## 2021-09-29 DIAGNOSIS — K219 Gastro-esophageal reflux disease without esophagitis: Secondary | ICD-10-CM

## 2021-09-29 DIAGNOSIS — Z0000 Encounter for general adult medical examination without abnormal findings: Secondary | ICD-10-CM

## 2021-09-29 DIAGNOSIS — Z8781 Personal history of (healed) traumatic fracture: Secondary | ICD-10-CM | POA: Diagnosis not present

## 2021-09-29 LAB — COMPREHENSIVE METABOLIC PANEL
ALT: 13 U/L (ref 0–35)
AST: 19 U/L (ref 0–37)
Albumin: 4 g/dL (ref 3.5–5.2)
Alkaline Phosphatase: 78 U/L (ref 39–117)
BUN: 19 mg/dL (ref 6–23)
CO2: 28 mEq/L (ref 19–32)
Calcium: 9.4 mg/dL (ref 8.4–10.5)
Chloride: 101 mEq/L (ref 96–112)
Creatinine, Ser: 0.99 mg/dL (ref 0.40–1.20)
GFR: 54.36 mL/min — ABNORMAL LOW (ref >60.00)
Glucose, Bld: 105 mg/dL — ABNORMAL HIGH (ref 70–99)
Potassium: 4.2 mEq/L (ref 3.5–5.1)
Sodium: 138 mEq/L (ref 135–145)
Total Bilirubin: 0.7 mg/dL (ref 0.2–1.2)
Total Protein: 6.8 g/dL (ref 6.0–8.3)

## 2021-09-29 LAB — LIPID PANEL
Cholesterol: 198 mg/dL (ref 0–200)
HDL: 50.7 mg/dL (ref >39.00)
LDL Cholesterol: 119 mg/dL — ABNORMAL HIGH (ref 0–99)
NonHDL: 147.43
Total CHOL/HDL Ratio: 4
Triglycerides: 141 mg/dL (ref 0.0–149.0)
VLDL: 28.2 mg/dL (ref 0.0–40.0)

## 2021-09-29 LAB — CBC WITH DIFFERENTIAL/PLATELET
Basophils Absolute: 0 10*3/uL (ref 0.0–0.1)
Basophils Relative: 0.6 % (ref 0.0–3.0)
Eosinophils Absolute: 0.1 10*3/uL (ref 0.0–0.7)
Eosinophils Relative: 1.1 % (ref 0.0–5.0)
HCT: 41.8 % (ref 36.0–46.0)
Hemoglobin: 14.1 g/dL (ref 12.0–15.0)
Lymphocytes Relative: 19.2 % (ref 12.0–46.0)
Lymphs Abs: 1.4 10*3/uL (ref 0.7–4.0)
MCHC: 33.7 g/dL (ref 30.0–36.0)
MCV: 96.7 fl (ref 78.0–100.0)
Monocytes Absolute: 0.5 10*3/uL (ref 0.1–1.0)
Monocytes Relative: 6.8 % (ref 3.0–12.0)
Neutro Abs: 5.4 10*3/uL (ref 1.4–7.7)
Neutrophils Relative %: 72.3 % (ref 43.0–77.0)
Platelets: 226 10*3/uL (ref 150.0–400.0)
RBC: 4.33 Mil/uL (ref 3.87–5.11)
RDW: 12.7 % (ref 11.5–15.5)
WBC: 7.4 10*3/uL (ref 4.0–10.5)

## 2021-09-29 LAB — VITAMIN D 25 HYDROXY (VIT D DEFICIENCY, FRACTURES): VITD: 89.45 ng/mL (ref 30.00–100.00)

## 2021-09-29 LAB — TSH: TSH: 2.11 u[IU]/mL (ref 0.35–5.50)

## 2021-09-29 MED ORDER — LISINOPRIL 5 MG PO TABS: 5.0000 mg | ORAL_TABLET | Freq: Every day | ORAL | 3 refills | Status: DC

## 2021-09-29 NOTE — Progress Notes (Signed)
Subjective  Chief Complaint  Patient presents with   Annual Exam   Diabetes    Wants to discuss    Hypertension    HPI: Veronica Myers is a 81 y.o. female who presents to [facility] today for a Female Wellness Visit. She also has the concerns and/or needs as listed above in the chief complaint. These will be addressed in addition to the Health Maintenance Visit.   Wellness Visit: annual visit with health maintenance review and exam without Pap  HM: pt declines mammogram and breast exam today. Imms up to date. She is doing well overall! Has moved into an apartment and is enjoying her independence. Still able to see daughters and their families often.   Chronic disease f/u and/or acute problem visit: (deemed necessary to be done in addition to the wellness visit): HTN: home readings 140s/70s on toprol xl 100 daily. Does complain of some fatigue. Feeling well. Taking medications w/o adverse effects. No symptoms of CHF, angina; no palpitations, sob, cp or lower extremity edema. Compliant with meds.  Mood: depression is very well controlled on cymbalta. Phq9 is zero today. Tolerating well. GERD is controlled on omeprazole chronically. Also feel yoga and exercise help her bowels and digestion.  Sleep remains less than ideal but chronic problem and managing behaviorally Osteoporosis: h/o compression fracture. Due for dexa. Due for prolia. This is her 3rd dose in last 2 years (missed one dose). Last shot 7 months ago. Tolerates well. Yoga and walks for exercise.   Assessment  1. Annual physical exam   2. Essential hypertension   3. History of compression fracture of spine   4. Psychophysiologic insomnia   5. Major depression, recurrent, chronic (HCC)   6. Gastroesophageal reflux disease without esophagitis   7. Age-related osteoporosis without current pathological fracture      Plan  Female Wellness Visit: Age appropriate Health Maintenance and Prevention  measures were discussed with patient. Included topics are cancer screening recommendations, ways to keep healthy (see AVS) including dietary and exercise recommendations, regular eye and dental care, use of seat belts, and avoidance of moderate alcohol use and tobacco use. Declines mammogram BMI: discussed patient's BMI and encouraged positive lifestyle modifications to help get to or maintain a target BMI. HM needs and immunizations were addressed and ordered. See below for orders. See HM and immunization section for updates. utd Routine labs and screening tests ordered including cmp, cbc and lipids where appropriate. Discussed recommendations regarding Vit D and calcium supplementation (see AVS)  Chronic disease management visit and/or acute problem visit: HTN: fairly well controlled. Toprol xl 100 daily: may be causing some fatigue; discussed with patient. Tolerable for now. Will continue and add lisinopril 2.5 - 5 mg daily to get systolics better controlled. Check renal function and electrolytes. Osteoporosis: due for prolia: will get reverified and set up appt for injection. Due dexa. Ordered today.  Depression is now well controlled; continue cymbalta 20mg  daily.  GERD is well controlled on daily omeprazole 20   Follow up: 3 mo for htn check up on additional medication  Orders Placed This Encounter  Procedures   DG Bone Density   CBC with Differential/Platelet   Comprehensive metabolic panel   Lipid panel   TSH   VITAMIN D 25 Hydroxy (Vit-D Deficiency, Fractures)   Meds ordered this encounter  Medications   lisinopril (ZESTRIL) 5 MG tablet    Sig: Take 1 tablet (5 mg total) by mouth daily.  Dispense:  90 tablet    Refill:  3      Body mass index is 30.25 kg/m. 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     Essential hypertension 12/12/2017   GERD (gastroesophageal reflux disease) 12/12/2017   History of diverticulitis 12/12/2017   Osteoporosis 12/12/2017    Dexa 02/2018 t + -4.1 lowest spine with compression fracture of spine 08/2019; From old medical records. No dexa report attached. Had been on fosamax.  Started prolia 10/2019    Health Maintenance  Topic Date Due   COVID-19 Vaccine (3 - Pfizer risk series) 02/08/2020   DEXA SCAN  10/28/2021 (Originally 03/09/2021)   Pneumonia Vaccine 37+ Years old  Completed   INFLUENZA VACCINE  Completed   Zoster Vaccines- Shingrix  Completed   HPV VACCINES  Aged Out   MAMMOGRAM  Discontinued   Hepatitis C Screening  Discontinued   Immunization History  Administered Date(s) Administered   Fluad Quad(high Dose 65+) 07/16/2019, 06/09/2021   Influenza, High Dose Seasonal PF 07/04/2020   PFIZER(Purple Top)SARS-COV-2 Vaccination 12/21/2019, 01/11/2020   Pneumococcal Conjugate-13 03/30/2018   Pneumococcal Polysaccharide-23 07/16/2019   Zoster Recombinat (Shingrix) 03/30/2018, 06/10/2021   We updated and reviewed the patient's past history in detail and it is documented below. Allergies: Patient has No Known Allergies. 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 Past Surgical History Patient  has no past surgical  history on file. Family History: Patient family history includes Arthritis in her father and mother; Depression in her father and mother; Diabetes in her father and mother; Heart disease in her father and mother; Hypertension in her father and mother. Social History:  Patient  reports that she has never smoked. She has never used smokeless tobacco. She reports that she does not currently use alcohol. She reports that she does not use drugs.  Review of Systems: Constitutional: negative for fever or malaise Ophthalmic: negative for photophobia, double vision or loss of vision Cardiovascular: negative for chest pain, dyspnea on exertion, or new LE swelling Respiratory: negative for SOB or persistent cough Gastrointestinal: negative for abdominal pain, change in bowel habits or melena Genitourinary: negative for dysuria or gross hematuria, no abnormal uterine bleeding or disharge Musculoskeletal: negative for new gait disturbance or muscular weakness Integumentary: negative for new or persistent rashes, no breast lumps Neurological: negative for TIA or stroke symptoms Psychiatric: negative for SI or delusions Allergic/Immunologic: negative for hives  Patient Care Team    Relationship Specialty Notifications Start End  Leamon Arnt, MD PCP - General Family Medicine  12/12/17   Gregor Hams, MD Consulting Physician Sports Medicine  09/10/19     Objective  Vitals: BP (!) 145/77 (BP Location: Left Arm, Patient Position: Sitting, Cuff Size: Normal)    Pulse 61  Temp (!) 97.3 F (36.3 C) (Temporal)    Ht 5\' 2"  (1.575 m)    Wt 165 lb 6.4 oz (75 kg)    BMI 30.25 kg/m  General:  Well developed, well nourished, no acute distress  Psych:  Alert and orientedx3,normal mood and affect HEENT:  Normocephalic, atraumatic, non-icteric sclera,  supple neck without adenopathy, mass or thyromegaly Cardiovascular:  Normal S1, S2, RRR without gallop, rub or murmur Respiratory:  Good breath sounds  bilaterally, CTAB with normal respiratory effort Gastrointestinal: normal bowel sounds, soft, non-tender, no noted masses. No HSM MSK: no deformities, contusions. Joints are without erythema or swelling.  Skin:  Warm, no rashes or suspicious lesions noted Neurologic:    Mental status is normal. Gross motor and sensory exams are normal. Normal gait. No tremor   Commons side effects, risks, benefits, and alternatives for medications and treatment plan prescribed today were discussed, and the patient expressed understanding of the given instructions. Patient is instructed to call or message via MyChart if he/she has any questions or concerns regarding our treatment plan. No barriers to understanding were identified. We discussed Red Flag symptoms and signs in detail. Patient expressed understanding regarding what to do in case of urgent or emergency type symptoms.  Medication list was reconciled, printed and provided to the patient in AVS. Patient instructions and summary information was reviewed with the patient as documented in the AVS. This note was prepared with assistance of Dragon voice recognition software. Occasional wrong-word or sound-a-like substitutions may have occurred due to the inherent limitations of voice recognition software  This visit occurred during the SARS-CoV-2 public health emergency.  Safety protocols were in place, including screening questions prior to the visit, additional usage of staff PPE, and extensive cleaning of exam room while observing appropriate contact time as indicated for disinfecting solutions.

## 2021-09-29 NOTE — Patient Instructions (Signed)
Please return in 3 months to recheck your blood pressure.   I will release your lab results to you on your MyChart account with further instructions. Please reply with any questions.    Please start the lisinopril in addition to the toprol xl daily. You may take them together. You may start with 1/2 pill of the lisinopril for 1-2 weeks to make sure you tolerate it. If your blood pressures remain high, then you could increase to the full pill.   We will call you to get you your prolia injection set up.   If you have any questions or concerns, please don't hesitate to send me a message via MyChart or call the office at [phone]. Thank you for visiting with Veronica Myers today! It's our pleasure caring for you.   I have ordered a mammogram and/or bone density for you as we discussed today: []   Mammogram  [x]   Bone Density  Please call the office checked below to schedule your appointment:  [x]   [facility]      [address] [place],        [address] Wing         []   [facility]  [address] [place],  [facility]

## 2021-09-30 ENCOUNTER — Encounter: Payer: Self-pay | Admitting: Family Medicine

## 2021-09-30 ENCOUNTER — Other Ambulatory Visit: Payer: Self-pay | Admitting: *Deleted

## 2021-09-30 DIAGNOSIS — E782 Mixed hyperlipidemia: Secondary | ICD-10-CM | POA: Insufficient documentation

## 2021-09-30 MED ORDER — ROSUVASTATIN CALCIUM 10 MG PO TABS: 10.0000 mg | ORAL_TABLET | Freq: Every evening | ORAL | 3 refills | Status: DC

## 2021-10-05 ENCOUNTER — Telehealth: Payer: Self-pay | Admitting: Family Medicine

## 2021-10-05 ENCOUNTER — Other Ambulatory Visit: Payer: Self-pay | Admitting: Family Medicine

## 2021-10-05 NOTE — Telephone Encounter (Signed)
Spoke with patient stated BP running 156/87. Before taking medication Thought medication not working and took double dose of bp med  Advise to monitor BP for a week and send information via my chart for PCP to review  Do not double medication with out PCP approval  Patient verbalized understanding

## 2021-10-05 NOTE — Telephone Encounter (Signed)
Discussed care with daughter.  Lisinopril 2.5 did not elevated blood pressure, but mother's anxiety does: so we will stop the lisinopril and monitor and reassure. Her bp looked good at ov.  She is on crestor and tolerating it.  For elevated ascvd.  Reassured.   Follow up as directed.

## 2021-10-05 NOTE — Telephone Encounter (Signed)
Pt states she was recently prescribed a new med to help with her high blood pressure. But her it is not helping and her blood pressure keeps going up. Please advise   lisinopril (ZESTRIL) 5 MG tablet

## 2021-10-05 NOTE — Telephone Encounter (Signed)
Daughter, Veronica Myers has called in stating that patient needs to know cholesterol levels.   States patient was told her cholesterol level was high, therefore they believe causing her to have anxiety and high BP.  States patient was given lisinopril.  Daughter states patients bp over the weekend was 180/90.  Daughter has instructed patient to stop taking the lisinopril due to the high bp number.  Daughter states patient is noncompliant with medications and will change her doses when she believes she needs too.  Daughter is upset stating she can not believe our office did not go into detail with patient in regard to her cholesterol.  Also states that patient will not ask questions or understand medical advise given.    I have advised for daughter to maybe start coming to patients OV, as long as patient is ok with this.   Daughter is requesting call back in regard at [phone].

## 2021-10-05 NOTE — Telephone Encounter (Signed)
Spoke with patient daughter  Daughter stated patient Stop taking Rx Lisinopril due to elevated BP  Stated after first dose of Lisinopril bp was in range of 180/90 patient did not double mediation as she stated in early call. Patient took 2 pills in two days that elevated her blood pressure both days. Please advise

## 2021-10-06 ENCOUNTER — Telehealth: Payer: Self-pay | Admitting: Family Medicine

## 2021-10-06 NOTE — Telephone Encounter (Signed)
Patient has OV tomorrow with PCP  

## 2021-10-06 NOTE — Telephone Encounter (Signed)
Pt's daughter states pt is still having high blood pressure. States even just getting out of bed it is running about 175/92. See previous notes. I sent to Triage. Waiting on triage notes.

## 2021-10-07 ENCOUNTER — Ambulatory Visit (INDEPENDENT_AMBULATORY_CARE_PROVIDER_SITE_OTHER): Payer: Medicare HMO | Admitting: Family Medicine

## 2021-10-07 ENCOUNTER — Encounter: Payer: Self-pay | Admitting: Family Medicine

## 2021-10-07 ENCOUNTER — Other Ambulatory Visit: Payer: Self-pay

## 2021-10-07 ENCOUNTER — Telehealth: Payer: Self-pay | Admitting: Family Medicine

## 2021-10-07 VITALS — BP 139/90 | HR 60 | Temp 97.9°F | Ht 63.0 in | Wt 163.8 lb

## 2021-10-07 DIAGNOSIS — E782 Mixed hyperlipidemia: Secondary | ICD-10-CM

## 2021-10-07 DIAGNOSIS — I10 Essential (primary) hypertension: Secondary | ICD-10-CM | POA: Diagnosis not present

## 2021-10-07 MED ORDER — ROSUVASTATIN CALCIUM 10 MG PO TABS: 10.0000 mg | ORAL_TABLET | ORAL | 3 refills | Status: DC

## 2021-10-07 NOTE — Progress Notes (Signed)
Subjective  CC:  Chief Complaint  Patient presents with   Hypertension   Hyperlipidemia    HPI: Veronica Myers is a 81 y.o. female who presents to the office today to address the problems listed above in the chief complaint. Patient was here last week for complete physical.  Blood pressure was just above normal so I added lisinopril 5 mg and told to start at 2.5 daily.  Her atherosclerotic risk was also elevated and so she was recommended to start Crestor 10 mg nightly.  She tends to be very sensitive with medications.  Even taking Benadryl will elevate her blood pressure she says.  So over the last several days she has been checking her blood pressure multiple times during the day and it has been as high as 170s over 90s.  She feels fine.  However she thinks the medications are causing these problems.  She also reports some myalgias with Crestor although they started immediately with the medication and stopped immediately after.  She only took 2 doses.  Assessment  1. Essential hypertension   2. Mixed hyperlipidemia      Plan  Essential hypertension: She does have a strong anxiety component related to medications.  Counseling done.  Today her blood pressure looks good.  We will stop the lisinopril and continue to monitor. Hyperlipidemia: Mainly statin is indicated due to the elevated atherosclerotic risk score.  Education given.  She will try taking the Crestor 10 mg weekly.  Follow up: 6 months for blood pressure follow-up 12/17/2021  No orders of the defined types were placed in this encounter.  Meds ordered this encounter  Medications   rosuvastatin (CRESTOR) 10 MG tablet    Sig: Take 1 tablet (10 mg total) by mouth once a week.    Dispense:  90 tablet    Refill:  3      I reviewed the patients updated PMH, FH, and SocHx.    Patient Active Problem List   Diagnosis Date Noted   Mixed hyperlipidemia 09/30/2021   Psychophysiologic insomnia 04/11/2020   Degenerative joint  disease (DJD) of lumbar spine 11/16/2019   Compression fracture of thoracic vertebra with routine healing 08/07/2019   Thoracogenic scoliosis of thoracic region 07/16/2019   Vitiligo 03/31/2018   Major depression, recurrent, chronic (HCC) 12/12/2017   Essential hypertension 12/12/2017   GERD (gastroesophageal reflux disease) 12/12/2017   History of diverticulitis 12/12/2017   Osteoporosis 12/12/2017   Current Meds  Medication Sig   DULoxetine (CYMBALTA) 20 MG capsule Take 1 capsule (20 mg total) by mouth daily.   metoprolol succinate (TOPROL-XL) 100 MG 24 hr tablet TAKE 1 TABLET BY MOUTH EVERY DAY   Multiple Vitamins-Minerals (ZINC PO) Take by mouth.    triamcinolone lotion (KENALOG) 0.1 % Apply 1 application topically 2 (two) times daily as needed (for seborrhea dermatitis (scalp rash)).    Allergies: Patient has No Known Allergies. Family History: Patient family history includes Arthritis in her father and mother; Depression in her father and mother; Diabetes in her father and mother; Heart disease in her father and mother; Hypertension in her father and mother. Social History:  Patient  reports that she has never smoked. She has never used smokeless tobacco. She reports that she does not currently use alcohol. She reports that she does not use drugs.  Review of Systems: Constitutional: Negative for fever malaise or anorexia Cardiovascular: negative for chest pain Respiratory: negative for SOB or persistent cough Gastrointestinal: negative for abdominal pain  Objective  Vitals: BP 139/90    Pulse 60    Temp 97.9 F (36.6 C) (Temporal)    Ht 5\' 3"  (1.6 m)    Wt 163 lb 12.8 oz (74.3 kg)    SpO2 99%    BMI 29.02 kg/m  General: no acute distress , A&Ox3 HEENT: PEERL, conjunctiva normal, neck is supple Cardiovascular:  RRR without murmur or gallop.  Respiratory:  Good breath sounds bilaterally, CTAB with normal respiratory effort Skin:  Warm, no rashes    Commons side effects,  risks, benefits, and alternatives for medications and treatment plan prescribed today were discussed, and the patient expressed understanding of the given instructions. Patient is instructed to call or message via MyChart if he/she has any questions or concerns regarding our treatment plan. No barriers to understanding were identified. We discussed Red Flag symptoms and signs in detail. Patient expressed understanding regarding what to do in case of urgent or emergency type symptoms.  Medication list was reconciled, printed and provided to the patient in AVS. Patient instructions and summary information was reviewed with the patient as documented in the AVS. This note was prepared with assistance of Dragon voice recognition software. Occasional wrong-word or sound-a-like substitutions may have occurred due to the inherent limitations of voice recognition software  This visit occurred during the SARS-CoV-2 public health emergency.  Safety protocols were in place, including screening questions prior to the visit, additional usage of staff PPE, and extensive cleaning of exam room while observing appropriate contact time as indicated for disinfecting solutions.

## 2021-10-07 NOTE — Telephone Encounter (Signed)
Pt has an appt 10/07/21 @ 9am with Dr Mardelle Matte.   Patient Name: Veronica Myers Gender: Female DOB: Jan 02, 1941 Age: 81 Y 2 M 12 D Return Phone Number: [phone] (Primary) Address: City/ State/ Zip: [place]  [address] Client [facility] - Administrator, [facility] at [facility] Provider Asencion Partridge- MD Contact Type Call Who Is Calling Patient / Member / Family / Caregiver Call Type Triage / Clinical Caller Name Trula Ore Relationship To Patient Daughter Return Phone Number [phone] (Primary) Chief Complaint BLOOD PRESSURE HIGH - Diastolic (bottom number) 120 or greater Reason for Call Symptomatic / Request for Health Information Initial Comment Caller states her mother has high blood pressure of 175/92. Translation No Nurse Assessment Nurse: Annye English, RN, Denise Date/Time (Eastern Time): 10/06/2021 11:30:11 AM Confirm and document reason for call. If symptomatic, describe symptoms. ---Caller states her mother has high blood pressure of 175/92. Pt denies other s/s. Took BP med this am. Does the patient have any new or worsening symptoms? ---Yes Will a triage be completed? ---Yes Related visit to physician within the last 2 weeks? ---Yes Does the PT have any chronic conditions? (i.e. diabetes, asthma, this includes High risk factors for pregnancy, etc.) ---Yes List chronic conditions. ---HTN Is this a behavioral health or substance abuse call? ---No Guidelines Guideline Title Affirmed Question Affirmed Notes Nurse Date/Time (Eastern Time) Blood Pressure - High Systolic BP >= 160 OR Diastolic >= 100 Carmon, RN, Denise 10/06/2021 11:31:58 AM Disp. Time Lamount Cohen Time) Disposition Final User 10/06/2021 11:26:07 AM Send to Urgent Queue Izora Gala 10/06/2021 11:33:37 AM SEE PCP WITHIN 3 DAYS Yes Carmon, RN, Leighton Ruff Disagree/Comply Comply Caller Understands Yes PreDisposition Call Doctor Care Advice  Given Per Guideline SEE PCP WITHIN 3 DAYS: HIGH BLOOD PRESSURE - LIFESTYLE MODIFICATIONS: * DECREASE SODIUM INTAKE: Aim to eat less than 2.4 g (100 mmol) of sodium each day. Unfortunately 75% of the salt in the average person's diet is in preprocessed foods. CALL BACK IF: * Your blood pressure is over 180/110 * You become worse CARE ADVICE given per High Blood Pressure (Adult) guideline. Referrals REFERRED TO PCP OFFICE

## 2021-10-07 NOTE — Patient Instructions (Signed)
Please return in 6 months for hypertension follow up.  ° °If you have any questions or concerns, please don't hesitate to send me a message via MyChart or call the office at [phone]. Thank you for visiting with us today! It's our pleasure caring for you.  °

## 2021-10-22 ENCOUNTER — Telehealth: Payer: Self-pay

## 2021-10-22 ENCOUNTER — Telehealth: Payer: Self-pay | Admitting: Family Medicine

## 2021-10-22 NOTE — Telephone Encounter (Signed)
Patient wants to talk to MA re medication and BP. Wants to know if she can cut med in half.

## 2021-10-22 NOTE — Telephone Encounter (Signed)
I called patient after receiving a message regarding her BP being low and her BP medication. Pt states she couldn't remember how she was told to take her BP medications, At home her BP today was in the 120/70's after taking her medication. I informed her for atleast 1-2 weeks she should be taking 1/2 of her Lisinopril and her Metoprolol together to see if she can tolerate them. Pt gave a verbal understanding.

## 2021-10-22 NOTE — Telephone Encounter (Signed)
Returned patients call and spoke with patient.

## 2021-10-28 ENCOUNTER — Other Ambulatory Visit: Payer: Self-pay

## 2021-10-28 ENCOUNTER — Encounter: Payer: Self-pay | Admitting: Family Medicine

## 2021-10-28 ENCOUNTER — Ambulatory Visit (INDEPENDENT_AMBULATORY_CARE_PROVIDER_SITE_OTHER): Payer: Medicare HMO | Admitting: Family Medicine

## 2021-10-28 VITALS — BP 139/88 | HR 69 | Temp 97.2°F | Ht 63.0 in | Wt 162.0 lb

## 2021-10-28 DIAGNOSIS — I10 Essential (primary) hypertension: Secondary | ICD-10-CM

## 2021-10-28 DIAGNOSIS — F419 Anxiety disorder, unspecified: Secondary | ICD-10-CM

## 2021-10-28 DIAGNOSIS — F339 Major depressive disorder, recurrent, unspecified: Secondary | ICD-10-CM | POA: Diagnosis not present

## 2021-10-28 NOTE — Progress Notes (Signed)
? ?Subjective  ?CC:  ?Chief Complaint  ?Patient presents with  ? Hypertension  ? ? ?HPI: Veronica Myers is a 81 y.o. female who presents to the office today to address the problems listed above in the chief complaint. ?Hypertension f/u: Patient is concerned about her blood pressure again.  Please see her last 2 visits.  Home blood pressures are mostly normal although she had 1 elevated reading 2 nights ago.  She admits she was stressed at the time.  No other symptoms.  She is taking her metoprolol daily and did add her lisinopril back ?Stress and anxiety: Continues on duloxetine.  Have been well controlled but now stressing again.  She increased her duloxetine to twice daily dosing over the last week.  In the past she did not do well with higher dosing.  No specific triggers although she is traveling to [place] later this month for a wedding.  As well, something that was happening with her granddaughter stressed out recently.  She admits that with increased stress, she has more IBS symptoms and gas.  No blood in the stool or significant abdominal pain. ? ?Assessment  ?1. Essential hypertension   ?2. Major depression, recurrent, chronic (HCC)   ?3. Anxiety disorder, unspecified type   ? ?  ?Plan  ? ?Hypertension f/u: BP control is fairly well controlled. Reassured. Home readings are elevated often due to stress reaction. Counseling done. Rec deep breathing and meditation. No med changes today ?mood f/u: continue duloxetine 20mg  daily.  ? ?Education regarding management of these chronic disease states was given. Management strategies discussed on successive visits include dietary and exercise recommendations, goals of achieving and maintaining IBW, and lifestyle modifications aiming for adequate sleep and minimizing stressors.  ? ?Follow up: prn ? ?No orders of the defined types were placed in this encounter. ? ?No orders of the defined types were placed in this encounter. ? ?  ? ?BP Readings from Last 3 Encounters:   ?10/28/21 139/88  ?10/07/21 139/90  ?09/29/21 (!) 145/77  ? ?Wt Readings from Last 3 Encounters:  ?10/28/21 162 lb (73.5 kg)  ?10/07/21 163 lb 12.8 oz (74.3 kg)  ?09/29/21 165 lb 6.4 oz (75 kg)  ? ? ?Lab Results  ?Component Value Date  ? CHOL 198 09/29/2021  ? CHOL 188 09/11/2020  ? CHOL 194 09/10/2019  ? ?Lab Results  ?Component Value Date  ? HDL 50.70 09/29/2021  ? HDL 55.90 09/11/2020  ? HDL 64.90 09/10/2019  ? ?Lab Results  ?Component Value Date  ? LDLCALC 119 (H) 09/29/2021  ? LDLCALC 108 (H) 09/11/2020  ? LDLCALC 100 (H) 09/10/2019  ? ?Lab Results  ?Component Value Date  ? TRIG 141.0 09/29/2021  ? TRIG 118.0 09/11/2020  ? TRIG 146.0 09/10/2019  ? ?Lab Results  ?Component Value Date  ? CHOLHDL 4 09/29/2021  ? CHOLHDL 3 09/11/2020  ? CHOLHDL 3 09/10/2019  ? ?No results found for: LDLDIRECT ?Lab Results  ?Component Value Date  ? CREATININE 0.99 09/29/2021  ? BUN 19 09/29/2021  ? NA 138 09/29/2021  ? K 4.2 09/29/2021  ? CL 101 09/29/2021  ? CO2 28 09/29/2021  ? ? ?The 10-year ASCVD risk score (Arnett DK, et al., 2019) is: 35.2% ?  Values used to calculate the score: ?    Age: 58 years ?    Sex: Female ?    Is Non-Hispanic African American: No ?    Diabetic: No ?    Tobacco smoker: No ?  Systolic Blood Pressure: 139 mmHg ?    Is BP treated: Yes ?    HDL Cholesterol: 50.7 mg/dL ?    Total Cholesterol: 198 mg/dL ? ?I reviewed the patients updated PMH, FH, and SocHx.  ?  ?Patient Active Problem List  ? Diagnosis Date Noted  ? Mixed hyperlipidemia 09/30/2021  ? Psychophysiologic insomnia 04/11/2020  ? 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 Compression fracture of thoracic vertebra with routine healing 08/07/2019  ? Thoracogenic scoliosis of thoracic region 07/16/2019  ? Vitiligo 03/31/2018  ? Major depression, recurrent, chronic (HCC) 12/12/2017  ? Essential hypertension 12/12/2017  ? GERD (gastroesophageal reflux disease) 12/12/2017  ? History of diverticulitis 12/12/2017  ? Osteoporosis  12/12/2017  ? ? ?Allergies: Patient has no known allergies. ? ?Social History: ?Patient  reports that she has never smoked. She has never used smokeless tobacco. She reports that she does not currently use alcohol. She reports that she does not use drugs. ? ?Current Meds  ?Medication Sig  ? DULoxetine (CYMBALTA) 20 MG capsule Take 1 capsule (20 mg total) by mouth daily.  ? metoprolol succinate (TOPROL-XL) 100 MG 24 hr tablet TAKE 1 TABLET BY MOUTH EVERY DAY  ? Multiple Vitamins-Minerals (ZINC PO) Take by mouth.   ? pantoprazole (PROTONIX) 20 MG tablet Take 1 tablet (20 mg total) by mouth daily.  ? rosuvastatin (CRESTOR) 10 MG tablet Take 1 tablet (10 mg total) by mouth once a week.  ? triamcinolone lotion (KENALOG) 0.1 % Apply 1 application topically 2 (two) times daily as needed (for seborrhea dermatitis (scalp rash)).  ? ? ?Review of Systems: ?Cardiovascular: negative for chest pain, palpitations, leg swelling, orthopnea ?Respiratory: negative for SOB, wheezing or persistent cough ?Gastrointestinal: negative for abdominal pain ?Genitourinary: negative for dysuria or gross hematuria ? ?Objective  ?Vitals: BP 139/88   Pulse 69   Temp (!) 97.2 ?F (36.2 ?C) (Temporal)   Ht 5\' 3"  (1.6 m)   Wt 162 lb (73.5 kg)   SpO2 98%   BMI 28.70 kg/m?  ?General: no acute distress  ?Psych:  Alert and oriented, normal mood and affect ?HEENT:  Normocephalic, atraumatic, supple neck  ?Cardiovascular:  RRR without murmur. no edema ?Respiratory:  Good breath sounds bilaterally, CTAB with normal respiratory effort ?Skin:  Warm, no rashes ?Neurologic:   Mental status is normal ?Commons side effects, risks, benefits, and alternatives for medications and treatment plan prescribed today were discussed, and the patient expressed understanding of the given instructions. Patient is instructed to call or message via MyChart if he/she has any questions or concerns regarding our treatment plan. No barriers to understanding were identified. We  discussed Red Flag symptoms and signs in detail. Patient expressed understanding regarding what to do in case of urgent or emergency type symptoms.  ?Medication list was reconciled, printed and provided to the patient in AVS. Patient instructions and summary information was reviewed with the patient as documented in the AVS. ?This note was prepared with assistance of . Occasional wrong-word or sound-a-like substitutions may have occurred due to the inherent limitations of voice recognition software ? ?This visit occurred during the SARS-CoV-2 public health emergency.  Safety protocols were in place, including screening questions prior to the visit, additional usage of staff PPE, and extensive cleaning of exam room while observing appropriate contact time as indicated for disinfecting solutions.  ?

## 2021-11-02 ENCOUNTER — Telehealth: Payer: Self-pay

## 2021-11-02 NOTE — Telephone Encounter (Signed)
[employer] Verified  ? ?Prolia Approved  ? ?Prior Auth : Approved June 21, 2021 ?PA Case: [id number], Status: Approved, Coverage Starts on: 08/30/2021 12:00:00 AM, Coverage Ends on: 08/29/2022 12:00:00 AM. ? ?Patient Fee: $300 Dollars Ready To Schedule ? ?

## 2021-11-02 NOTE — Telephone Encounter (Signed)
Spoke to patient 11/02/21- Patient not ready to sch apt. Pt going out of town. Patient will call back when ready to sch.  ?

## 2021-12-01 ENCOUNTER — Telehealth: Payer: Self-pay

## 2021-12-17 ENCOUNTER — Ambulatory Visit: Payer: Medicare HMO

## 2021-12-17 NOTE — Telephone Encounter (Signed)
Pt ready for scheduling on or after 11/02/21 ? ?Out-of-pocket cost due at time of visit: $301 ? ?Primary: [employer] Medicare ?Prolia co-insurance: 20% (approximately $276) ?Admin fee co-insurance: 20% (approximately $25) ? ?Secondary: n/a ?Prolia co-insurance:  ?Admin fee co-insurance:  ? ?Deductible: does not apply ? ?Prior Auth: APPROVED ?PA# [id number] ?Valid: 08/30/21-08/29/22 ? ?** This summary of benefits is an estimation of the patient's out-of-pocket cost. Exact cost may vary based on individual plan coverage.  ? ?

## 2021-12-29 NOTE — Telephone Encounter (Signed)
Pt declined scheduling appt. ? ?Pt states a "nurse told me insurance would only pay for one shot a year". Pt states she has not had a shot in two years and does not think "it is worth it" to have the shot now. ? ?CSR could not get patient to elaborate further than the above information.  ?

## 2022-01-05 NOTE — Telephone Encounter (Signed)
Pt archived in [email or web address].  Please advise if patient and/or provider wish to proceed with Prolia therpay.  

## 2022-02-09 ENCOUNTER — Ambulatory Visit (INDEPENDENT_AMBULATORY_CARE_PROVIDER_SITE_OTHER): Payer: Medicare HMO

## 2022-02-09 DIAGNOSIS — Z0000 Encounter for general adult medical examination without abnormal findings: Secondary | ICD-10-CM

## 2022-02-09 NOTE — Progress Notes (Signed)
Virtual Visit via Telephone Note  I connected with  Veronica Myers on 02/09/22 at  9:00 AM EDT by telephone and verified that I am speaking with the correct person using two identifiers.  Medicare Annual Wellness visit completed telephonically due to Covid-19 pandemic.   Persons participating in this call: This Health Coach and this patient.   Location: Patient: Home Provider: Office   I discussed the limitations, risks, security and privacy concerns of performing an evaluation and management service by telephone and the availability of in person appointments. The patient expressed understanding and agreed to proceed.  Unable to perform video visit due to video visit attempted and failed and/or patient does not have video capability.   Some vital signs may be absent or patient reported.   Marzella Schleinina H Amirr Achord, LPN   Subjective:   Veronica Myers is a 81 y.o. female who presents for Medicare Annual (Subsequent) preventive examination.  Review of Systems     Cardiac Risk Factors include: advanced age (>4555men, 49>65 women);hypertension     Objective:    There were no vitals filed for this visit. There is no height or weight on file to calculate BMI.     02/09/2022    9:09 AM 12/11/2020    1:40 PM 09/10/2019    9:19 AM 07/20/2019   12:53 PM 07/18/2019    1:55 PM 06/24/2019    1:26 PM  Advanced Directives  Does Patient Have a Medical Advance Directive? No No No No No No  Would patient like information on creating a medical advance directive? No - Patient declined No - Patient declined No - Patient declined No - Patient declined No - Guardian declined Yes (ED - Information included in AVS)    Current Medications (verified) Outpatient Encounter Medications as of 02/09/2022  Medication Sig   DULoxetine (CYMBALTA) 20 MG capsule Take 1 capsule (20 mg total) by mouth daily.   metoprolol succinate (TOPROL-XL) 100 MG 24 hr tablet TAKE 1 TABLET BY MOUTH EVERY DAY   Multiple  Vitamins-Minerals (ZINC PO) Take by mouth.    pantoprazole (PROTONIX) 20 MG tablet Take 1 tablet (20 mg total) by mouth daily.   rosuvastatin (CRESTOR) 10 MG tablet Take 1 tablet (10 mg total) by mouth once a week.   triamcinolone lotion (KENALOG) 0.1 % Apply 1 application topically 2 (two) times daily as needed (for seborrhea dermatitis (scalp rash)).   [DISCONTINUED] lisinopril (ZESTRIL) 5 MG tablet Take 5 mg by mouth daily.   No facility-administered encounter medications on file as of 02/09/2022.    Allergies (verified) Patient has no known allergies.   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 No dexa report attached. Had been on fosamax.    Vitiligo 03/31/2018   History reviewed. No pertinent surgical history. Family History  Problem Relation Age of Onset   Arthritis Mother    Depression Mother    Diabetes Mother    Heart disease Mother    Hypertension Mother    Arthritis Father    Depression Father    Diabetes Father    Heart disease Father    Hypertension Father    Social History   Socioeconomic History   Marital status: Widowed    Spouse name: Not on file  Number of children: 3   Years of education: Not on file   Highest education level: Not on file  Occupational History   Not on file  Tobacco Use   Smoking status: Never   Smokeless tobacco: Never  Vaping Use   Vaping Use: Never used  Substance and Sexual Activity   Alcohol use: Not Currently   Drug use: Never   Sexual activity: Not Currently  Other Topics Concern   Not on file  Social History Narrative   Not on file   Social Determinants of Health   Financial Resource Strain: Low Risk  (02/09/2022)   Overall Financial Resource Strain (CARDIA)    Difficulty of Paying  Living Expenses: Not hard at all  Food Insecurity: No Food Insecurity (02/09/2022)   Hunger Vital Sign    Worried About Running Out of Food in the Last Year: Never true    Ran Out of Food in the Last Year: Never true  Transportation Needs: No Transportation Needs (02/09/2022)   PRAPARE - Administrator, Civil Service (Medical): No    Lack of Transportation (Non-Medical): No  Physical Activity: Sufficiently Active (02/09/2022)   Exercise Vital Sign    Days of Exercise per Week: 5 days    Minutes of Exercise per Session: 60 min  Stress: No Stress Concern Present (02/09/2022)   [employer] of Occupational Health - Occupational Stress Questionnaire    Feeling of Stress : Not at all  Social Connections: Socially Isolated (02/09/2022)   Social Connection and Isolation Panel [NHANES]    Frequency of Communication with Friends and Family: More than three times a week    Frequency of Social Gatherings with Friends and Family: More than three times a week    Attends Religious Services: Never    Database administrator or Organizations: No    Attends Banker Meetings: Never    Marital Status: Widowed    Tobacco Counseling Counseling given: Not Answered   Clinical Intake:  Pre-visit preparation completed: Yes  Pain : No/denies pain     BMI - recorded: 28.7 Nutritional Status: BMI 25 -29 Overweight Nutritional Risks: None Diabetes: No  How often do you need to have someone help you when you read instructions, pamphlets, or other written materials from your doctor or pharmacy?: 1 - Never  Diabetic?no  Interpreter Needed?: No  Information entered by :: Lanier Ensign, LPN   Activities of Daily Living    02/09/2022    9:10 AM  In your present state of health, do you have any difficulty performing the following activities:  Hearing? 1  Comment slight loss  Vision? 0  Difficulty concentrating or making decisions? 0  Walking or climbing stairs? 0   Dressing or bathing? 0  Doing errands, shopping? 0  Preparing Food and eating ? N  Using the Toilet? N  In the past six months, have you accidently leaked urine? N  Do you have problems with loss of bowel control? N  Managing your Medications? N  Managing your Finances? N  Housekeeping or managing your Housekeeping? N    Patient Care Team: Willow Ora, MD as PCP - General (Family Medicine) Rodolph Bong, MD as Consulting Physician (Sports Medicine)  Indicate any recent Medical Services you may have received from other than [facility] providers in the past year (date may be approximate).     Assessment:   This is a routine wellness examination for [facility].  Hearing/Vision screen Hearing Screening -  Comments:: Pt stated slight loss  Vision Screening - Comments:: Pt follows up with provider [place] [address]   Dietary issues and exercise activities discussed: Current Exercise Habits: Home exercise routine, Type of exercise: yoga;walking, Time (Minutes): > 60, Frequency (Times/Week): 5, Weekly Exercise (Minutes/Week): 0   Goals Addressed             This Visit's Progress    Patient Stated       None at this time        Depression Screen    02/09/2022    9:08 AM 10/07/2021    8:58 AM 06/09/2021    9:27 AM 12/11/2020    1:38 PM 09/11/2020    8:29 AM 10/22/2019    3:02 PM 09/10/2019    9:24 AM  PHQ 2/9 Scores  PHQ - 2 Score 0 0 0 0 0 0 5  PHQ- 9 Score  0 0   0 16    Fall Risk    02/09/2022    9:10 AM 09/29/2021    9:58 AM 12/11/2020    1:41 PM 09/11/2020    8:29 AM 09/10/2019    9:19 AM  Fall Risk   Falls in the past year? 0 0 0 0 0  Number falls in past yr: 0 0 0 0   Injury with Fall? 0 0 0 0 0  Risk for fall due to : Impaired vision No Fall Risks     Follow up Falls prevention discussed Falls evaluation completed Falls prevention discussed  Falls evaluation completed;Education provided;Falls prevention discussed    FALL RISK PREVENTION PERTAINING TO THE HOME:  Any  stairs in or around the home? Yes  If so, are there any without handrails? Yes  Home free of loose throw rugs in walkways, pet beds, electrical cords, etc? Yes  Adequate lighting in your home to reduce risk of falls? Yes   ASSISTIVE DEVICES UTILIZED TO PREVENT FALLS:  Life alert? No  Use of a cane, walker or w/c? No  Grab bars in the bathroom? No  Shower chair or bench in shower? No  Elevated toilet seat or a handicapped toilet? No   TIMED UP AND GO:  Was the test performed? No .  Cognitive Function:        12/11/2020    1:44 PM  6CIT Screen  What Year? 0 points  What month? 0 points  Count back from 20 0 points  Months in reverse 0 points  Repeat phrase 0 points    Immunizations Immunization History  Administered Date(s) Administered   Fluad Quad(high Dose 65+) 07/16/2019, 06/09/2021   Influenza, High Dose Seasonal PF 07/04/2020   PFIZER(Purple Top)SARS-COV-2 Vaccination 12/21/2019, 01/11/2020   Pneumococcal Conjugate-13 03/30/2018   Pneumococcal Polysaccharide-23 07/16/2019   Zoster Recombinat (Shingrix) 03/30/2018, 06/10/2021      Flu Vaccine status: Up to date  Pneumococcal vaccine status: Up to date  Covid-19 vaccine status: Completed vaccines  Qualifies for Shingles Vaccine? Yes   Zostavax completed Yes   Shingrix Completed?: Yes  Screening Tests Health Maintenance  Topic Date Due   COVID-19 Vaccine (3 - Pfizer risk series) 02/08/2020   DEXA SCAN  02/10/2023 (Originally 03/09/2021)   INFLUENZA VACCINE  03/30/2022   Pneumonia Vaccine 21+ Years old  Completed   Zoster Vaccines- Shingrix  Completed   HPV VACCINES  Aged Out   MAMMOGRAM  Discontinued   Hepatitis C Screening  Discontinued    Health Maintenance  Health Maintenance Due  Topic Date Due  COVID-19 Vaccine (3 - Pfizer risk series) 02/08/2020    Colorectal cancer screening: No longer required.   Mammogram status: No longer required due to Discontinued.  Bone Density status:  Completed 03/09/18. Results reflect: Bone density results: OSTEOPOROSIS. Repeat every 3 years.   Additional Screening:  Hepatitis C Screening: does not qualify;   Vision Screening: Recommended annual ophthalmology exams for early detection of glaucoma and other disorders of the eye. Is the patient up to date with their annual eye exam?  Yes  Who is the provider or what is the name of the office in which the patient attends annual eye exams? Provider [place] [address] If pt is not established with a provider, would they like to be referred to a provider to establish care? No .   Dental Screening: Recommended annual dental exams for proper oral hygiene  Community Resource Referral / Chronic Care Management: CRR required this visit?  No   CCM required this visit?  No      Plan:     I have personally reviewed and noted the following in the patient's chart:   Medical and social history Use of alcohol, tobacco or illicit drugs  Current medications and supplements including opioid prescriptions.  Functional ability and status Nutritional status Physical activity Advanced directives List of other physicians Hospitalizations, surgeries, and ER visits in previous 12 months Vitals Screenings to include cognitive, depression, and falls Referrals and appointments  In addition, I have reviewed and discussed with patient certain preventive protocols, quality metrics, and best practice recommendations. A written personalized care plan for preventive services as well as general preventive health recommendations were provided to patient.     Marzella Schlein, LPN   10/13/863   Nurse Notes: None

## 2022-02-09 NOTE — Patient Instructions (Addendum)
[name] , Thank you for taking time to come for your Medicare Wellness Visit. I appreciate your ongoing commitment to your health goals. Please review the following plan we discussed and let me know if I can assist you in the future.   Screening recommendations/referrals: Colonoscopy: no longer required per pt  Mammogram: No longer required  Bone Density: Declined at this time Recommended yearly ophthalmology/optometry visit for glaucoma screening and checkup Recommended yearly dental visit for hygiene and checkup  Vaccinations: Influenza vaccine: Done 06/09/21 repeat every year  Pneumococcal vaccine: Up to date Tdap vaccine: not a candidate  Shingles vaccine: Completed 03/30/18 & 06/10/21   Covid-19:Completed 4/23, 01/11/20  Advanced directives: Advance directive discussed with you today. Even though you declined this today please call our office should you change your mind and we can give you the proper paperwork for you to fill out.  Conditions/risks identified: None at this time   Next appointment: Follow up in one year for your annual wellness visit    Preventive Care 65 Years and Older, Female Preventive care refers to lifestyle choices and visits with your health care provider that can promote health and wellness. What does preventive care include? A yearly physical exam. This is also called an annual well check. Dental exams once or twice a year. Routine eye exams. Ask your health care provider how often you should have your eyes checked. Personal lifestyle choices, including: Daily care of your teeth and gums. Regular physical activity. Eating a healthy diet. Avoiding tobacco and drug use. Limiting alcohol use. Practicing safe sex. Taking low-dose aspirin every day. Taking vitamin and mineral supplements as recommended by your health care provider. What happens during an annual well check? The services and screenings done by your health care provider during your  annual well check will depend on your age, overall health, lifestyle risk factors, and family history of disease. Counseling  Your health care provider may ask you questions about your: Alcohol use. Tobacco use. Drug use. Emotional well-being. Home and relationship well-being. Sexual activity. Eating habits. History of falls. Memory and ability to understand (cognition). Work and work Astronomer. Reproductive health. Screening  You may have the following tests or measurements: Height, weight, and BMI. Blood pressure. Lipid and cholesterol levels. These may be checked every 5 years, or more frequently if you are over 59 years old. Skin check. Lung cancer screening. You may have this screening every year starting at age 14 if you have a 30-pack-year history of smoking and currently smoke or have quit within the past 15 years. Fecal occult blood test (FOBT) of the stool. You may have this test every year starting at age 30. Flexible sigmoidoscopy or colonoscopy. You may have a sigmoidoscopy every 5 years or a colonoscopy every 10 years starting at age 79. Hepatitis C blood test. Hepatitis B blood test. Sexually transmitted disease (STD) testing. Diabetes screening. This is done by checking your blood sugar (glucose) after you have not eaten for a while (fasting). You may have this done every 1-3 years. Bone density scan. This is done to screen for osteoporosis. You may have this done starting at age 48. Mammogram. This may be done every 1-2 years. Talk to your health care provider about how often you should have regular mammograms. Talk with your health care provider about your test results, treatment options, and if necessary, the need for more tests. Vaccines  Your health care provider may recommend certain vaccines, such as: Influenza vaccine. This is recommended  every year. Tetanus, diphtheria, and acellular pertussis (Tdap, Td) vaccine. You may need a Td booster every 10  years. Zoster vaccine. You may need this after age 108. Pneumococcal 13-valent conjugate (PCV13) vaccine. One dose is recommended after age 66. Pneumococcal polysaccharide (PPSV23) vaccine. One dose is recommended after age 40. Talk to your health care provider about which screenings and vaccines you need and how often you need them. This information is not intended to replace advice given to you by your health care provider. Make sure you discuss any questions you have with your health care provider. Document Released: 09/12/2015 Document Revised: 05/05/2016 Document Reviewed: 06/17/2015 Elsevier Interactive Patient Education  2017 [employer] Prevention in the Home Falls can cause injuries. They can happen to people of all ages. There are many things you can do to make your home safe and to help prevent falls. What can I do on the outside of my home? Regularly fix the edges of walkways and driveways and fix any cracks. Remove anything that might make you trip as you walk through a door, such as a raised step or threshold. Trim any bushes or trees on the path to your home. Use bright outdoor lighting. Clear any walking paths of anything that might make someone trip, such as rocks or tools. Regularly check to see if handrails are loose or broken. Make sure that both sides of any steps have handrails. Any raised decks and porches should have guardrails on the edges. Have any leaves, snow, or ice cleared regularly. Use sand or salt on walking paths during winter. Clean up any spills in your garage right away. This includes oil or grease spills. What can I do in the bathroom? Use night lights. Install grab bars by the toilet and in the tub and shower. Do not use towel bars as grab bars. Use non-skid mats or decals in the tub or shower. If you need to sit down in the shower, use a plastic, non-slip stool. Keep the floor dry. Clean up any water that spills on the floor as soon as it  happens. Remove soap buildup in the tub or shower regularly. Attach bath mats securely with double-sided non-slip rug tape. Do not have throw rugs and other things on the floor that can make you trip. What can I do in the bedroom? Use night lights. Make sure that you have a light by your bed that is easy to reach. Do not use any sheets or blankets that are too big for your bed. They should not hang down onto the floor. Have a firm chair that has side arms. You can use this for support while you get dressed. Do not have throw rugs and other things on the floor that can make you trip. What can I do in the kitchen? Clean up any spills right away. Avoid walking on wet floors. Keep items that you use a lot in easy-to-reach places. If you need to reach something above you, use a strong step stool that has a grab bar. Keep electrical cords out of the way. Do not use floor polish or wax that makes floors slippery. If you must use wax, use non-skid floor wax. Do not have throw rugs and other things on the floor that can make you trip. What can I do with my stairs? Do not leave any items on the stairs. Make sure that there are handrails on both sides of the stairs and use them. Fix handrails that are broken  or loose. Make sure that handrails are as long as the stairways. Check any carpeting to make sure that it is firmly attached to the stairs. Fix any carpet that is loose or worn. Avoid having throw rugs at the top or bottom of the stairs. If you do have throw rugs, attach them to the floor with carpet tape. Make sure that you have a light switch at the top of the stairs and the bottom of the stairs. If you do not have them, ask someone to add them for you. What else can I do to help prevent falls? Wear shoes that: Do not have high heels. Have rubber bottoms. Are comfortable and fit you well. Are closed at the toe. Do not wear sandals. If you use a stepladder: Make sure that it is fully opened.  Do not climb a closed stepladder. Make sure that both sides of the stepladder are locked into place. Ask someone to hold it for you, if possible. Clearly mark and make sure that you can see: Any grab bars or handrails. First and last steps. Where the edge of each step is. Use tools that help you move around (mobility aids) if they are needed. These include: Canes. Walkers. Scooters. Crutches. Turn on the lights when you go into a dark area. Replace any light bulbs as soon as they burn out. Set up your furniture so you have a clear path. Avoid moving your furniture around. If any of your floors are uneven, fix them. If there are any pets around you, be aware of where they are. Review your medicines with your doctor. Some medicines can make you feel dizzy. This can increase your chance of falling. Ask your doctor what other things that you can do to help prevent falls. This information is not intended to replace advice given to you by your health care provider. Make sure you discuss any questions you have with your health care provider. Document Released: 06/12/2009 Document Revised: 01/22/2016 Document Reviewed: 09/20/2014 Elsevier Interactive Patient Education  2017 [employer].

## 2022-03-29 ENCOUNTER — Ambulatory Visit (INDEPENDENT_AMBULATORY_CARE_PROVIDER_SITE_OTHER): Payer: Medicare HMO | Admitting: Family Medicine

## 2022-03-29 ENCOUNTER — Encounter: Payer: Self-pay | Admitting: Family Medicine

## 2022-03-29 VITALS — BP 150/70 | HR 52 | Temp 97.6°F | Ht 62.0 in | Wt 160.4 lb

## 2022-03-29 DIAGNOSIS — R0789 Other chest pain: Secondary | ICD-10-CM | POA: Diagnosis not present

## 2022-03-29 DIAGNOSIS — I10 Essential (primary) hypertension: Secondary | ICD-10-CM

## 2022-03-29 MED ORDER — CHLORTHALIDONE 25 MG PO TABS: 25.0000 mg | ORAL_TABLET | Freq: Every day | ORAL | 0 refills | Status: DC

## 2022-03-29 MED ORDER — NITROGLYCERIN 0.4 MG SL SUBL: 0.4000 mg | SUBLINGUAL_TABLET | SUBLINGUAL | 0 refills | Status: AC | PRN

## 2022-03-29 NOTE — Progress Notes (Signed)
Subjective:     Patient ID: Veronica Myers, female    DOB: Apr 05, 1941, 81 y.o.   MRN: [id number]  Chief Complaint  Patient presents with   Hypertension    Having some issues with high blood pressure since last Sunday    HPI Taking bp for 1 wk and bp's up and down.  On metoprolol 100mg . And another pill-looks like lisinopril 5mg -but bp inc so stopped in April.  But then bp up and restarted 1 mo ago. Pt stopped the crestor 1 wk ago.  Bp seemed to go higher once the lisinopril was added. Had "weird" pain in middle of night one night last wk.  Pt wasn't able to sleep which is normal and felt pain L arm(fx in past) and "squeezing pain" L chest and back and L arm. No sob/nausea/sweats and thought stress so did some deep breathing and lasted .  So then pt inc cymbalta to bid.  Started checking bp's again-138/75.  Then every day bid and 140's-160's/70-90.  This morning 170/91.   This am-some chest "squeezing" around 7 am and was just lying in bed-rad to back and down L arm. No sob. Breathing helped.  Lasted .  Bp was 170/91 so called for appt here.   Does Yoga daily and ok w/that but doesn't really walk/other exercise.   In [place] was taking 60mg  cymbalta-now on 20mg , but pt inc to 20mg  bid.  Was on 200mg  metoprolol in [place] but dec to 100 here.  In general, no energy.   There are no preventive care reminders to display for this patient.  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 No dexa report attached. Had been on fosamax.    Vitiligo 03/31/2018    History reviewed. No pertinent surgical history.  Outpatient Medications Prior to Visit  Medication Sig Dispense Refill   DULoxetine (CYMBALTA) 20 MG capsule Take 1 capsule (20 mg total) by mouth daily. 90 capsule 3    metoprolol succinate (TOPROL-XL) 100 MG 24 hr tablet TAKE 1 TABLET BY MOUTH EVERY DAY 90 tablet 3   Multiple Vitamins-Minerals (ZINC PO) Take by mouth.      pantoprazole (PROTONIX) 20 MG tablet Take 1 tablet (20 mg total) by mouth daily. 90 tablet 3   rosuvastatin (CRESTOR) 10 MG tablet Take 1 tablet (10 mg total) by mouth once a week. 90 tablet 3   triamcinolone lotion (KENALOG) 0.1 % Apply 1 application topically 2 (two) times daily as needed (for seborrhea dermatitis (scalp rash)). 60 mL 2   No facility-administered medications prior to visit.    No Known Allergies ROS neg/noncontributory except as noted HPI/below      Objective:     BP (!) 150/70   Pulse (!) 52   Temp 97.6 F (36.4 C) (Temporal)   Ht 5\' 2"  (1.575 m)   Wt 160 lb 6 oz (72.7 kg)   SpO2 96%   BMI 29.33 kg/m  Wt Readings from Last 3 Encounters:  03/29/22 160 lb 6 oz (72.7 kg)  10/28/21 162 lb (73.5 kg)  10/07/21 163 lb 12.8 oz (74.3 kg)    Physical Exam   Gen: WDWN NAD repeat bp 150/70 HEENT: NCAT, conjunctiva not injected, sclera nonicteric NECK:  supple, no thyromegaly, no  nodes, no carotid bruits CARDIAC: RRR, S1S2+, no murmur. DP 2+B LUNGS: CTAB. No wheezes ABDOMEN:  BS+, soft, NTND, No HSM, no masses EXT:  no edema MSK: no gross abnormalities.  No TTP chest wall NEURO: A&O x3.  CN II-XII intact.  PSYCH: normal mood. Good eye contact  Looks like ua not done   Language barrier so took extra time.  Ekg-ns t wave inv III, aVr V1.  Bradycardia  Spent 45 min w/pt(exclusive of time for EKG).  Clarifiying meds, why on and off, getting details on history/exam.  Ordering tests, explaining concerns for heart, discussing plan, ntg, etc.       Assessment & Plan:   Problem List Items Addressed This Visit       Cardiovascular and Mediastinum   Essential hypertension - Primary   Relevant Medications   nitroGLYCERIN (NITROSTAT) 0.4 MG SL tablet   chlorthalidone (HYGROTON) 25 MG tablet   Other  Relevant Orders   EKG 12-Lead (Completed)   CBC   Comprehensive metabolic panel   POCT urinalysis dipstick   Other Visit Diagnoses     Other chest pain       Relevant Orders   EKG 12-Lead (Completed)   Ambulatory referral to Cardiology      HTN-chronic.  Elevated now.  Not sure if d/t lisinopril, stress, other.  Stop lisinopril.  Cont metoprolol.  Add chlorthalidone 25mg .  Check cbc,ua,cmp Chest pain.  If recurs-ER.  Ntg if recurs, then ambulance to ER if not resolve.   Urgent referral to card.  F/u Dr. 2-3 wks for bp  Meds ordered this encounter  Medications   nitroGLYCERIN (NITROSTAT) 0.4 MG SL tablet    Sig: Place 1 tablet (0.4 mg total) under the tongue every 5 (five) minutes as needed for chest pain.    Dispense:  25 tablet    Refill:  0   chlorthalidone (HYGROTON) 25 MG tablet    Sig: Take 1 tablet (25 mg total) by mouth daily.    Dispense:  30 tablet    Refill:  0    Mardelle Matte, MD

## 2022-03-29 NOTE — Patient Instructions (Addendum)
Stop lisinopril Add chlorthalidone - monitor bp  ER if Chest pain not improve after nitro.

## 2022-03-30 LAB — COMPREHENSIVE METABOLIC PANEL
ALT: 26 U/L (ref 0–35)
AST: 29 U/L (ref 0–37)
Albumin: 4.1 g/dL (ref 3.5–5.2)
Alkaline Phosphatase: 87 U/L (ref 39–117)
BUN: 19 mg/dL (ref 6–23)
CO2: 27 mEq/L (ref 19–32)
Calcium: 9.3 mg/dL (ref 8.4–10.5)
Chloride: 102 mEq/L (ref 96–112)
Creatinine, Ser: 0.89 mg/dL (ref 0.40–1.20)
GFR: 61.55 mL/min (ref >60.00)
Glucose, Bld: 85 mg/dL (ref 70–99)
Potassium: 4.7 mEq/L (ref 3.5–5.1)
Sodium: 136 mEq/L (ref 135–145)
Total Bilirubin: 0.6 mg/dL (ref 0.2–1.2)
Total Protein: 7 g/dL (ref 6.0–8.3)

## 2022-03-30 LAB — CBC
HCT: 39.5 % (ref 36.0–46.0)
Hemoglobin: 13.6 g/dL (ref 12.0–15.0)
MCHC: 34.4 g/dL (ref 30.0–36.0)
MCV: 96.2 fl (ref 78.0–100.0)
Platelets: 207 10*3/uL (ref 150.0–400.0)
RBC: 4.11 Mil/uL (ref 3.87–5.11)
RDW: 12.6 % (ref 11.5–15.5)
WBC: 7.1 10*3/uL (ref 4.0–10.5)

## 2022-03-31 ENCOUNTER — Telehealth: Payer: Self-pay | Admitting: Family Medicine

## 2022-03-31 NOTE — Telephone Encounter (Signed)
Spoke with Aliene Beams (daughter) and went over lab results. Patient scheduled with Dr. Mardelle Matte for 04/01/2022 to discuss urine issues.

## 2022-03-31 NOTE — Telephone Encounter (Signed)
Patient's daughter Trula Ore requests to be called at ph# [phone] after 11 am today (03/31/22) to be given Patient's lab results.   Trula Ore states that Patient may not have told Dr. Ruthine Dose at appointment on 03/29/22 that she has had blood in her urine for some time.  Trula Ore states she is concerned that Patient may have issues with her kidneys or bladder causing high blood pressure.   Patient is scheduled for appointment 04/01/22. Christina declined appointment for 03/31/22.  Routing to [facility] and [facility].

## 2022-04-01 ENCOUNTER — Encounter: Payer: Self-pay | Admitting: Family Medicine

## 2022-04-01 ENCOUNTER — Ambulatory Visit (INDEPENDENT_AMBULATORY_CARE_PROVIDER_SITE_OTHER): Payer: Medicare HMO | Admitting: Family Medicine

## 2022-04-01 VITALS — BP 138/60 | HR 66 | Temp 98.0°F | Ht 62.0 in | Wt 155.8 lb

## 2022-04-01 DIAGNOSIS — E782 Mixed hyperlipidemia: Secondary | ICD-10-CM

## 2022-04-01 DIAGNOSIS — R310 Gross hematuria: Secondary | ICD-10-CM | POA: Diagnosis not present

## 2022-04-01 DIAGNOSIS — K5900 Constipation, unspecified: Secondary | ICD-10-CM

## 2022-04-01 DIAGNOSIS — R0789 Other chest pain: Secondary | ICD-10-CM | POA: Diagnosis not present

## 2022-04-01 DIAGNOSIS — I10 Essential (primary) hypertension: Secondary | ICD-10-CM | POA: Diagnosis not present

## 2022-04-01 LAB — LIPID PANEL
Cholesterol: 178 mg/dL (ref 0–200)
HDL: 54.3 mg/dL (ref >39.00)
LDL Cholesterol: 93 mg/dL (ref 0–99)
NonHDL: 123.47
Total CHOL/HDL Ratio: 3
Triglycerides: 153 mg/dL — ABNORMAL HIGH (ref 0.0–149.0)
VLDL: 30.6 mg/dL (ref 0.0–40.0)

## 2022-04-01 LAB — POCT URINALYSIS DIPSTICK
Bilirubin, UA: NEGATIVE
Glucose, UA: NEGATIVE
Ketones, UA: NEGATIVE
Nitrite, UA: NEGATIVE
Protein, UA: POSITIVE — AB
Spec Grav, UA: 1.015 (ref 1.010–1.025)
Urobilinogen, UA: 0.2 E.U./dL
pH, UA: 6.5 (ref 5.0–8.0)

## 2022-04-01 LAB — URINALYSIS, ROUTINE W REFLEX MICROSCOPIC
Bilirubin Urine: NEGATIVE
Hgb urine dipstick: NEGATIVE
Nitrite: NEGATIVE
Specific Gravity, Urine: 1.015 (ref 1.000–1.030)
Total Protein, Urine: NEGATIVE
Urine Glucose: NEGATIVE
Urobilinogen, UA: 0.2 (ref 0.0–1.0)
pH: 7 (ref 5.0–8.0)

## 2022-04-01 LAB — TSH: TSH: 2.42 u[IU]/mL (ref 0.35–5.50)

## 2022-04-01 LAB — BASIC METABOLIC PANEL
BUN: 17 mg/dL (ref 6–23)
CO2: 31 mEq/L (ref 19–32)
Calcium: 9.6 mg/dL (ref 8.4–10.5)
Chloride: 98 mEq/L (ref 96–112)
Creatinine, Ser: 0.89 mg/dL (ref 0.40–1.20)
GFR: 61.55 mL/min (ref >60.00)
Glucose, Bld: 89 mg/dL (ref 70–99)
Potassium: 4.2 mEq/L (ref 3.5–5.1)
Sodium: 138 mEq/L (ref 135–145)

## 2022-04-01 NOTE — Progress Notes (Signed)
Subjective  CC:  Chief Complaint  Patient presents with   Hematuria    Pt stated that she has been seeing some blood in her urine that she noticed on 03/29/2022 and also having problems with constipation     HPI: Veronica Myers is a 81 y.o. female who presents to the office today to address the problems listed above in the chief complaint. Reviewed recent ov for chest pain 7/31. Non specific ekg changes. Has appt with Dr. [place] 8/8 for further eval. Denies further sxs and hasn't needed to use the offered NTG. Had nl cbc and cmp HTN:  had some percieved side effects on lisnopril; it was stopped and now on chlorthalidone and metoprolol xl 100 daily. Reports tolerating it.  Reports noted red possible blood on TP x 2 after voiding. Denies BRBPR. No pain or irritative sxs.  C/o bloating and constipation. Has dealt with same intermittently in past. No melena. Has h/o GERD. Nl appetite.  Assessment  1. Gross hematuria   2. Other chest pain   3. Essential hypertension   4. Constipation, unspecified constipation type   5. Mixed hyperlipidemia      Plan  Possible hematuria:  check UA micro and culture. Not clearly infected. Vague historian. Chest pain: educated further. On crestor but intermittently. Recheck lipids, add asa and see cards next week.  Miralax for constipation. Reassured. No red flag sxs HLD - reports she stopped crestor only 2 days ago but she changes her medications frequently (see last note). Recheck non fasting lipids today  Follow up: as scheduled.  04/14/2022  Orders Placed This Encounter  Procedures   Urine Culture   Urinalysis, Routine w reflex microscopic   Basic metabolic panel   Lipid panel   TSH   POCT Urinalysis Dipstick   No orders of the defined types were placed in this encounter.     I reviewed the patients updated PMH, FH, and SocHx.    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    Allergies: Patient has No Known Allergies. Family History: Patient family history includes Arthritis in her father and mother; Depression in her father and mother; Diabetes in her father and mother; Heart disease in her father and mother; Hypertension in her father and mother. Social History:  Patient  reports that she has never smoked. She has never used smokeless tobacco. She reports that she does not currently use alcohol. She reports that she does not use drugs.  Review of Systems: Constitutional: Negative for fever malaise or anorexia Cardiovascular: negative for chest pain Respiratory: negative for SOB or persistent cough Gastrointestinal: negative for abdominal pain  Objective  Vitals: BP 138/60   Pulse 66   Temp 98 F (36.7 C)   Ht 5\' 2"  (1.575 m)   Wt 155 lb 12.8 oz (70.7 kg)   SpO2 98%   BMI 28.50 kg/m  General: no acute distress , A&Ox3 HEENT: PEERL, conjunctiva  normal, neck is supple Cardiovascular:  RRR without murmur or gallop.  Respiratory:  Good breath sounds bilaterally, CTAB with normal respiratory effort Soft nontender abdomen. Skin:  Warm, no rashes  Office Visit on 04/01/2022  Component Date Value Ref Range Status   Color, UA 04/01/2022 yellow   Final   Clarity, UA 04/01/2022 clear   Final   Glucose, UA 04/01/2022 Negative  Negative Final   Bilirubin, UA 04/01/2022 neg   Final   Ketones,  UA 04/01/2022 neg   Final   Spec Grav, UA 04/01/2022 1.015  1.010 - 1.025 Final   Blood, UA 04/01/2022 trace   Final   pH, UA 04/01/2022 6.5  5.0 - 8.0 Final   Protein, UA 04/01/2022 Positive (A)  Negative Final   Urobilinogen, UA 04/01/2022 0.2  0.2 or 1.0 E.U./dL Final   Nitrite, UA 13/14/3888 neg   Final   Leukocytes, UA 04/01/2022 Small (1+) (A)  Negative Final     Commons side effects, risks, benefits, and alternatives for medications and treatment plan prescribed today were discussed, and the patient expressed understanding of the given instructions. Patient is instructed to call or message via MyChart if he/she has any questions or concerns regarding our treatment plan. No barriers to understanding were identified. We discussed Red Flag symptoms and signs in detail. Patient expressed understanding regarding what to do in case of urgent or emergency type symptoms.  Medication list was reconciled, printed and provided to the patient in AVS. Patient instructions and summary information was reviewed with the patient as documented in the AVS. This note was prepared with assistance of Dragon voice recognition software. Occasional wrong-word or sound-a-like substitutions may have occurred due to the inherent limitations of voice recognition software  This visit occurred during the SARS-CoV-2 public health emergency.  Safety protocols were in place, including screening questions prior to the visit, additional usage of staff PPE, and extensive cleaning of exam room while observing appropriate contact time as indicated for disinfecting solutions.

## 2022-04-01 NOTE — Patient Instructions (Addendum)
Please follow up as scheduled for your next visit with me: 04/14/2022   If you have any questions or concerns, please don't hesitate to send me a message via MyChart or call the office at [phone]. Thank you for visiting with Veronica Myers today! It's our pleasure caring for you.   Use miralax daily for constipation.   I will call you with the results of your urine tests.   See Dr. [place] on the 8th to discuss your chest pain. Go to the ER if it occurs again.

## 2022-04-02 LAB — URINE CULTURE
MICRO NUMBER:: 13732105
Result:: NO GROWTH
SPECIMEN QUALITY:: ADEQUATE

## 2022-04-04 NOTE — Progress Notes (Signed)
Cardiology Office Note   Date:  04/06/2022   ID:  Veronica Myers, DOB 1941/08/14, MRN [id number]  PCP:  Willow Ora, MD  Cardiologist:   Annmargaret Decaprio [place], MD   Chief Complaint  Patient presents with   Chest Pain      History of Present Illness: Veronica Myers is a 81 y.o. female who is seen at the request of Dr Ruthine Dose for evaluation of chest pain.  She has a history of HTN and HLD. She reports that 2 weeks ago at night she experienced pain in her precordium radiating to her left shoulder and into her back. This was a squeezing sensation.  Her back pain was sharp. Lasted less than 1 hour. Had one recurrence. She states her BP has been more labile recently. Under stress. Originally from [place]. Widowed x 6 years. Reports a stress test and Echo over 8 years ago while in [place]. Has 3 daughters. One lives here and her other 2 in [place].     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 No dexa report attached. Had been on fosamax.    Vitiligo 03/31/2018    History reviewed. No pertinent surgical history.   Current Outpatient Medications  Medication Sig Dispense Refill   chlorthalidone (HYGROTON) 25 MG tablet Take 1 tablet (25 mg total) by mouth daily. 30 tablet 0   DULoxetine (CYMBALTA) 20 MG capsule Take 1 capsule (20 mg total) by mouth daily. 90 capsule 3   metoprolol succinate (TOPROL-XL) 100 MG 24 hr tablet TAKE 1 TABLET BY MOUTH EVERY DAY 90 tablet 3   nitroGLYCERIN (NITROSTAT) 0.4 MG SL tablet Place 1 tablet (0.4 mg total) under the tongue every 5 (five) minutes as needed for chest pain. 25 tablet 0   rosuvastatin (CRESTOR) 10 MG tablet Take 1 tablet (10 mg total) by mouth once a week. 90 tablet 3   No current facility-administered  medications for this visit.    Allergies:   Patient has no known allergies.    Social History:  The patient  reports that she has never smoked. She has never used smokeless tobacco. She reports that she does not currently use alcohol. She reports that she does not use drugs.   Family History:  The patient's family history includes Arthritis in her father and mother; Depression in her father and mother; Diabetes in her father and mother; Heart disease in her father and mother; Hypertension in her father and mother.    ROS:  Please see the history of present illness.   Otherwise, review of systems are positive for none.   All other systems are reviewed and negative.    PHYSICAL EXAM: VS:  BP (!) 146/72   Pulse 68   Ht 5\' 2"  (1.575 m)   Wt 158 lb 6.4 oz (71.8 kg)   SpO2 98%   BMI 28.97 kg/m  , BMI Body mass index is 28.97 kg/m. GEN: Well nourished, well developed, in no acute distress HEENT: normal Neck: no JVD, carotid bruits, or masses Cardiac: RRR; no murmurs, rubs, or gallops,no edema  Respiratory:  clear to auscultation bilaterally, normal work of breathing GI: soft, nontender, nondistended, + BS MS: no deformity or atrophy Skin: warm and dry, no  rash Neuro:  Strength and sensation are intact Psych: euthymic mood, full affect   EKG:  EKG is not ordered today. The ekg ordered 03/29/22 demonstrates sinus brady rate 51. Otherwise normal. I have personally reviewed and interpreted this study.    Recent Labs: 03/29/2022: ALT 26; Hemoglobin 13.6; Platelets 207.0 04/01/2022: BUN 17; Creatinine, Ser 0.89; Potassium 4.2; Sodium 138; TSH 2.42    Lipid Panel    Component Value Date/Time   CHOL 178 04/01/2022 1155   TRIG 153.0 (H) 04/01/2022 1155   HDL 54.30 04/01/2022 1155   CHOLHDL 3 04/01/2022 1155   VLDL 30.6 04/01/2022 1155   LDLCALC 93 04/01/2022 1155      Wt Readings from Last 3 Encounters:  04/06/22 158 lb 6.4 oz (71.8 kg)  04/01/22 155 lb 12.8 oz (70.7 kg)   03/29/22 160 lb 6 oz (72.7 kg)      Other studies Reviewed: Additional studies/ records that were reviewed today include: none. Review of the above records demonstrates: N/A   ASSESSMENT AND PLAN:  1.  Chest pain - precordial. Concerning for angina. With labile HTN also need to consider possible aortic pathology. Needs ischemic evaluation. Recommend coronary CTA which will also allow [place] to assess her aorta. HR is nice and slow on Toprol XL and renal function is OK 2. HTN  3. Hypercholesterolemia. On Statin.      Current medicines are reviewed at length with the patient today.  The patient does not have concerns regarding medicines.  The following changes have been made:  no change  Labs/ tests ordered today include:   Orders Placed This Encounter  Procedures   CT CORONARY MORPH W/CTA COR W/SCORE W/CA W/CM &/OR WO/CM         Disposition:   FU post CTA  Signed, Khadeja Abt [place], MD  04/06/2022 9:11 AM    [facility] [address] [place], [place], [address] Phone [phone], Fax [phone]

## 2022-04-06 ENCOUNTER — Ambulatory Visit: Payer: Medicare HMO | Admitting: Cardiology

## 2022-04-06 ENCOUNTER — Encounter: Payer: Self-pay | Admitting: Cardiology

## 2022-04-06 VITALS — BP 146/72 | HR 68 | Ht 62.0 in | Wt 158.4 lb

## 2022-04-06 DIAGNOSIS — R072 Precordial pain: Secondary | ICD-10-CM

## 2022-04-06 DIAGNOSIS — I10 Essential (primary) hypertension: Secondary | ICD-10-CM

## 2022-04-06 DIAGNOSIS — E7800 Pure hypercholesterolemia, unspecified: Secondary | ICD-10-CM | POA: Diagnosis not present

## 2022-04-06 NOTE — Patient Instructions (Addendum)
Medication Instructions:  Continue same medications    Lab Work: None needed   Testing/Procedures: Coronary CT will be scheduled after approved by insurance  Follow instructions below   Follow-Up: At [facility], you and your health needs are our priority.  As part of our continuing mission to provide you with exceptional heart care, we have created designated Provider Care Teams.  These Care Teams include your primary Cardiologist (physician) and Advanced Practice Providers (APPs -  Physician Assistants and Nurse Practitioners) who all work together to provide you with the care you need, when you need it.  We recommend signing up for the patient portal called "MyChart".  Sign up information is provided on this After Visit Summary.  MyChart is used to connect with patients for Virtual Visits (Telemedicine).  Patients are able to view lab/test results, encounter notes, upcoming appointments, etc.  Non-urgent messages can be sent to your provider as well.   To learn more about what you can do with MyChart, go to [email or web address].       Your next appointment:  After test    The format for your next appointment:  Office     Provider:  Dr.Jordan       Your cardiac CT will be scheduled at one of the below locations:   [facility] [address] [place], [place] [address] [phone]  OR  [facility] [address] [place], [place] [address] [phone]  If scheduled at [facility], please arrive at the [facility] and Children's Entrance (Entrance C2) of [facility] 30 minutes prior to test start time. You can use the FREE valet parking offered at entrance C (encouraged to control the heart rate for the test)  Proceed to the [facility] Radiology Department (first floor) to check-in and test prep.  All radiology patients and guests should use entrance C2 at [facility], accessed  from [facility], even though the hospital's physical address listed is [address]    If scheduled at [facility], please arrive 15 mins early for check-in and test prep.  Please follow these instructions carefully (unless otherwise directed):   On the Night Before the Test: Be sure to Drink plenty of water. Do not consume any caffeinated/decaffeinated beverages or chocolate 12 hours prior to your test. Do not take any antihistamines 12 hours prior to your test.   On the Day of the Test: Drink plenty of water until 1 hour prior to the test. Do not eat any food 4 hours prior to the test. You may take your regular medications prior to the test.  Take metoprolol 100 mg two hours prior to test. FEMALES- please wear underwire-free bra if available, avoid dresses & tight clothing          After the Test: Drink plenty of water. After receiving IV contrast, you may experience a mild flushed feeling. This is normal. On occasion, you may experience a mild rash up to 24 hours after the test. This is not dangerous. If this occurs, you can take Benadryl 25 mg and increase your fluid intake. If you experience trouble breathing, this can be serious. If it is severe call 911 IMMEDIATELY. If it is mild, please call our office.   We will call to schedule your test 2-4 weeks out understanding that some insurance companies will need an authorization prior to the service being performed.   For non-scheduling related questions,  please contact the cardiac imaging nurse navigator should you have any questions/concerns: Rockwell Alexandria, Cardiac Imaging Nurse Navigator Larey Brick, Cardiac Imaging Nurse Navigator [facility] Direct Office Dial: [phone]   For scheduling needs, including cancellations and rescheduling, please call [place], [phone].    Important Information About Sugar

## 2022-04-13 ENCOUNTER — Other Ambulatory Visit: Payer: Self-pay | Admitting: Family Medicine

## 2022-04-14 ENCOUNTER — Encounter: Payer: Self-pay | Admitting: Family Medicine

## 2022-04-14 ENCOUNTER — Ambulatory Visit (INDEPENDENT_AMBULATORY_CARE_PROVIDER_SITE_OTHER): Payer: Medicare HMO | Admitting: Family Medicine

## 2022-04-14 VITALS — BP 128/70 | HR 76 | Temp 98.7°F | Ht 62.0 in | Wt 161.2 lb

## 2022-04-14 DIAGNOSIS — F339 Major depressive disorder, recurrent, unspecified: Secondary | ICD-10-CM | POA: Diagnosis not present

## 2022-04-14 DIAGNOSIS — R0989 Other specified symptoms and signs involving the circulatory and respiratory systems: Secondary | ICD-10-CM | POA: Diagnosis not present

## 2022-04-14 DIAGNOSIS — R0789 Other chest pain: Secondary | ICD-10-CM

## 2022-04-14 DIAGNOSIS — N3281 Overactive bladder: Secondary | ICD-10-CM

## 2022-04-14 NOTE — Patient Instructions (Signed)
Please return in January or February for your annual complete physical; please come fasting.   If you have any questions or concerns, please don't hesitate to send me a message via MyChart or call the office at [phone]. Thank you for visiting with [place] today! It's our pleasure caring for you.

## 2022-04-14 NOTE — Progress Notes (Signed)
Subjective  CC:  Chief Complaint  Patient presents with   gross hematuria    Pt here for F/U. Pt stated that she feels much better. Also is having some problems with sleeping     HPI: Veronica Myers is a 81 y.o. female who presents to the office today to address the problems listed above in the chief complaint. This is a short-term follow-up after a few visits that she has had for chest pain and urinary frequency with possible hematuria.  Please review those last note.  I did follow the cardiologist visit.  All of her lab work.  Fortunately she is doing better.  Her blood pressure has stabilized.  She continues metoprolol XL daily.  She had no further episodes of chest pain.  She has seen Dr. Sw[place] who has set her up for a coronary CTA to better evaluate her heart function and coronary arteries.  She remains on aspirin and statin. She discusses her mood and's abilities to handle stress.  She is on Cymbalta 20 mg daily.  She feels this is helpful.  At times she has taken an extra pill.  In the past she has been as high as 60 mg daily but but this gave her negative side effects.  She is asking if she needs to do anything different.  She reports she has never been a good sleeper. Again he asks about a bladder infection.  Her urine culture was negative.  At times she has sensation that she needs to empty her bladder and will not empty fully.  She will awaken a couple times at night to go to the bathroom.  Assessment  1. Other chest pain   2. Labile hypertension   3. Major depression, recurrent, chronic (HCC)   4. Overactive bladder      Plan  Chest pain: Worrisome for angina so we will have further cardiac testing with coronary CTA.  This is scheduled for August 29.  She does have follow-up with cardiology.  Continue aspirin, beta-blocker and statin.  She will go to emergency room if she has further episodes of chest pain. Labile hypertension: Normal readings today.  She does have a strong  stress response that has been typical over the last several years.  Encouraged to continue her Toprol daily. Depression stress management: Counseling done.  Continue Cymbalta 20 mg daily.  Do not recommend changing dose that she has done well on this over the last year and tends to have multiple having side effects with medication changes.  Recommend once daily dosing and recommend not adding extra pills as she feels needed.  Patient agrees Likely with overactive bladder.  Reassured, no infection.  Symptoms are mild.  Defer prescription management at this time  Follow up: January for complete physical Visit date not found  No orders of the defined types were placed in this encounter.  No orders of the defined types were placed in this encounter.     I reviewed the patients updated PMH, FH, and SocHx.    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 Swallow whole.   chlorthalidone (HYGROTON) 25 MG tablet Take 1 tablet (25 mg total) by mouth daily.   DULoxetine (CYMBALTA) 20 MG capsule Take 1 capsule (20 mg total) by mouth daily.   metoprolol succinate (TOPROL-XL) 100 MG 24 hr tablet TAKE 1 TABLET BY MOUTH EVERY DAY   nitroGLYCERIN (NITROSTAT) 0.4 MG SL tablet Place 1 tablet (0.4 mg total) under the tongue every 5 (five) minutes as needed for chest pain.   pantoprazole (PROTONIX) 20 MG tablet Take 20 mg by  mouth daily.   rosuvastatin (CRESTOR) 10 MG tablet Take 1 tablet (10 mg total) by mouth once a week.    Allergies: Patient has No Known Allergies. Family History: Patient family history includes Arthritis in her father and mother; Depression in her father and mother; Diabetes in her father and mother; Heart disease in her father and mother; Hypertension in her father and mother. Social History:  Patient  reports that she has never smoked. She has never used smokeless tobacco. She reports that she does not currently use alcohol. She reports that she does not use drugs.  Review of Systems: Constitutional: Negative for fever malaise or anorexia Cardiovascular: negative for chest pain Respiratory: negative for SOB or persistent cough Gastrointestinal: negative for abdominal pain  Objective  Vitals: BP 128/70   Pulse 76   Temp 98.7 F (37.1 C)   Ht 5\' 2"  (1.575 m)   Wt 161 lb 3.2 oz (73.1 kg)   SpO2 96%   BMI 29.48 kg/m  General: no acute distress , A&Ox3 HEENT: PEERL, conjunctiva normal, neck is supple Cardiovascular:  RRR without murmur or gallop.  Respiratory:  Good breath sounds bilaterally, CTAB with normal respiratory effort Skin:  Warm, no rashes  No visits with results within 1 Day(s) from this visit.  Latest known visit with results is:  Office Visit on 04/01/2022  Component Date Value Ref Range Status   Color, UA 04/01/2022 yellow   Final   Clarity, UA 04/01/2022 clear   Final   Glucose, UA 04/01/2022 Negative  Negative Final   Bilirubin, UA 04/01/2022 neg   Final   Ketones, UA 04/01/2022 neg   Final   Spec Grav, UA 04/01/2022 1.015  1.010 - 1.025 Final   Blood, UA 04/01/2022 trace   Final   pH, UA 04/01/2022 6.5  5.0 - 8.0 Final   Protein, UA 04/01/2022 Positive (A)  Negative Final   Urobilinogen, UA 04/01/2022 0.2  0.2 or 1.0 E.U./dL Final   Nitrite, UA 06/01/2022 neg   Final   Leukocytes, UA 04/01/2022 Small (1+) (A)  Negative Final   MICRO NUMBER: 04/01/2022  06/01/2022   Final   SPECIMEN QUALITY: 04/01/2022 Adequate   Final   Sample Source 04/01/2022 URINE, CLEAN CATCH   Final   STATUS: 04/01/2022 FINAL   Final   Result: 04/01/2022 No Growth   Final   Color, Urine 04/01/2022 YELLOW  Yellow;Lt. Yellow;Straw;Dark Yellow;Amber;Green;Red;Brown Final   APPearance 04/01/2022 CLEAR  Clear;Turbid;Slightly Cloudy;Cloudy Final   Specific Gravity, Urine 04/01/2022 1.015  1.000 - 1.030 Final   pH 04/01/2022 7.0  5.0 - 8.0 Final   Total Protein, Urine 04/01/2022 NEGATIVE  Negative Final   Urine Glucose 04/01/2022 NEGATIVE  Negative Final   Ketones, ur 04/01/2022 TRACE (A)  Negative Final   Bilirubin Urine 04/01/2022 NEGATIVE  Negative Final   Hgb urine dipstick  04/01/2022 NEGATIVE  Negative Final   Urobilinogen, UA 04/01/2022 0.2  0.0 - 1.0 Final   Leukocytes,Ua 04/01/2022 TRACE (A)  Negative Final   Nitrite 04/01/2022 NEGATIVE  Negative Final   WBC, UA 04/01/2022 3-6/hpf (A)  0-2/hpf Final   RBC / HPF 04/01/2022 0-2/hpf  0-2/hpf Final   Mucus, UA 04/01/2022 Presence of (A)  None Final   Squamous Epithelial / LPF 04/01/2022 Rare(0-4/hpf)  Rare(0-4/hpf) Final   Sodium 04/01/2022 138  135 - 145 mEq/L Final   Potassium 04/01/2022 4.2  3.5 - 5.1 mEq/L Final   Chloride 04/01/2022 98  96 - 112 mEq/L Final   CO2 04/01/2022 31  19 - 32 mEq/L Final   Glucose, Bld 04/01/2022 89  70 - 99 mg/dL Final   BUN 09/38/1829 17  6 - 23 mg/dL Final   Creatinine, Ser 04/01/2022 0.89  0.40 - 1.20 mg/dL Final   GFR 93/71/6967 61.55  >60.00 mL/min Final   Calcium 04/01/2022 9.6  8.4 - 10.5 mg/dL Final   Cholesterol 89/38/1017 178  0 - 200 mg/dL Final   Triglycerides 51/09/5850 153.0 (H)  0.0 - 149.0 mg/dL Final   HDL 77/82/4235 54.30  >39.00 mg/dL Final   VLDL 36/14/4315 30.6  0.0 - 40.0 mg/dL Final   LDL Cholesterol 04/01/2022 93  0 - 99 mg/dL Final   Total CHOL/HDL Ratio 04/01/2022 3   Final   NonHDL 04/01/2022 123.47   Final   TSH 04/01/2022 2.42  0.35 - 5.50 uIU/mL  Final     Commons side effects, risks, benefits, and alternatives for medications and treatment plan prescribed today were discussed, and the patient expressed understanding of the given instructions. Patient is instructed to call or message via MyChart if he/she has any questions or concerns regarding our treatment plan. No barriers to understanding were identified. We discussed Red Flag symptoms and signs in detail. Patient expressed understanding regarding what to do in case of urgent or emergency type symptoms.  Medication list was reconciled, printed and provided to the patient in AVS. Patient instructions and summary information was reviewed with the patient as documented in the AVS. This note was prepared with assistance of Dragon voice recognition software. Occasional wrong-word or sound-a-like substitutions may have occurred due to the inherent limitations of voice recognition software  This visit occurred during the SARS-CoV-2 public health emergency.  Safety protocols were in place, including screening questions prior to the visit, additional usage of staff PPE, and extensive cleaning of exam room while observing appropriate contact time as indicated for disinfecting solutions.

## 2022-04-16 ENCOUNTER — Other Ambulatory Visit: Payer: Self-pay

## 2022-04-16 ENCOUNTER — Telehealth: Payer: Self-pay | Admitting: Family Medicine

## 2022-04-16 MED ORDER — CHLORTHALIDONE 25 MG PO TABS: 25.0000 mg | ORAL_TABLET | Freq: Every day | ORAL | 0 refills | Status: DC

## 2022-04-16 NOTE — Telephone Encounter (Signed)
  LAST APPOINTMENT DATE:  Please schedule appointment if longer than 1 year 03/29/22  NEXT APPOINTMENT DATE: 09/30/22  MEDICATION:chlorthalidone (HYGROTON) 25 MG tablet  Is the patient out of medication? No-10 days left  PHARMACY:  [id number] [place], [place] - [address] Phone:  [phone]  Fax:  [phone]

## 2022-04-16 NOTE — Telephone Encounter (Signed)
Rx sent 

## 2022-04-21 ENCOUNTER — Other Ambulatory Visit: Payer: Self-pay | Admitting: Family Medicine

## 2022-04-23 ENCOUNTER — Other Ambulatory Visit: Payer: Self-pay | Admitting: Family Medicine

## 2022-04-26 ENCOUNTER — Telehealth (HOSPITAL_COMMUNITY): Payer: Self-pay | Admitting: *Deleted

## 2022-04-26 NOTE — Telephone Encounter (Signed)
Reaching out to patient to offer assistance regarding upcoming cardiac imaging study; pt verbalizes understanding of appt date/time, but wishes to cancel due to cost. She states that if she were to do this test in [place], she wouldn't have to pay anything out of pocket. I canceled the test and informed her I would let Dr. [place] know. She verbalized understanding.  Larey Brick RN Navigator Cardiac Imaging [facility] [phone] office [phone] cell

## 2022-04-27 ENCOUNTER — Ambulatory Visit (HOSPITAL_COMMUNITY): Admission: RE | Admit: 2022-04-27 | Payer: Medicare HMO | Source: Ambulatory Visit

## 2022-05-13 NOTE — Progress Notes (Deleted)
Cardiology Office Note   Date:  05/13/2022   ID:  Veronica Myers, DOB 1941/07/21, MRN [id number]  PCP:  Willow Ora, MD  Cardiologist:   Demetrios Byron [place], MD   No chief complaint on file.     History of Present Illness: Veronica Myers is a 81 y.o. female who is seen at the request of Dr Ruthine Dose for evaluation of chest pain.  She has a history of HTN and HLD. She reports that 2 weeks ago at night she experienced pain in her precordium radiating to her left shoulder and into her back. This was a squeezing sensation.  Her back pain was sharp. Lasted less than 1 hour. Had one recurrence. She states her BP has been more labile recently. Under stress. Originally from [place]. Widowed x 6 years. Reports a stress test and Echo over 8 years ago while in [place]. Has 3 daughters. One lives here and her other 2 in [place]. We had previously ordered a coronary CTA. She called stating she was going to try and have this done in [place] because she thought she could have it done less expensively there.     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 No dexa report attached. Had been on fosamax.    Vitiligo 03/31/2018    No past surgical history on file.   Current Outpatient Medications  Medication Sig Dispense Refill   aspirin EC 81 MG tablet Take 81 mg by mouth daily. Swallow whole.     chlorthalidone (HYGROTON) 25 MG tablet Take 1 tablet (25 mg total) by mouth daily. 30 tablet 0   DULoxetine (CYMBALTA) 20 MG capsule Take 1 capsule (20 mg total) by mouth daily. 90 capsule 3   metoprolol succinate (TOPROL-XL) 100 MG 24 hr tablet TAKE 1 TABLET BY MOUTH EVERY DAY 90 tablet 3   nitroGLYCERIN (NITROSTAT) 0.4 MG SL tablet Place 1 tablet (0.4 mg total) under the  tongue every 5 (five) minutes as needed for chest pain. 25 tablet 0   pantoprazole (PROTONIX) 20 MG tablet Take 20 mg by mouth daily.     rosuvastatin (CRESTOR) 10 MG tablet Take 1 tablet (10 mg total) by mouth once a week. 90 tablet 3   No current facility-administered medications for this visit.    Allergies:   Patient has no known allergies.    Social History:  The patient  reports that she has never smoked. She has never used smokeless tobacco. She reports that she does not currently use alcohol. She reports that she does not use drugs.   Family History:  The patient's family history includes Arthritis in her father and mother; Depression in her father and mother; Diabetes in her father and mother; Heart disease in her father and mother; Hypertension in her father and mother.    ROS:  Please see the history of present illness.   Otherwise, review of systems are positive for none.   All other systems are reviewed and negative.    PHYSICAL EXAM: VS:  There were no vitals taken for this visit. , BMI There is no height or weight on file to calculate BMI. GEN: Well nourished, well developed, in no acute distress HEENT: normal Neck: no JVD, carotid  bruits, or masses Cardiac: RRR; no murmurs, rubs, or gallops,no edema  Respiratory:  clear to auscultation bilaterally, normal work of breathing GI: soft, nontender, nondistended, + BS MS: no deformity or atrophy Skin: warm and dry, no rash Neuro:  Strength and sensation are intact Psych: euthymic mood, full affect   EKG:  EKG is not ordered today. The ekg ordered 03/29/22 demonstrates sinus brady rate 51. Otherwise normal. I have personally reviewed and interpreted this study.    Recent Labs: 03/29/2022: ALT 26; Hemoglobin 13.6; Platelets 207.0 04/01/2022: BUN 17; Creatinine, Ser 0.89; Potassium 4.2; Sodium 138; TSH 2.42    Lipid Panel    Component Value Date/Time   CHOL 178 04/01/2022 1155   TRIG 153.0 (H) 04/01/2022 1155   HDL  54.30 04/01/2022 1155   CHOLHDL 3 04/01/2022 1155   VLDL 30.6 04/01/2022 1155   LDLCALC 93 04/01/2022 1155      Wt Readings from Last 3 Encounters:  04/14/22 161 lb 3.2 oz (73.1 kg)  04/06/22 158 lb 6.4 oz (71.8 kg)  04/01/22 155 lb 12.8 oz (70.7 kg)      Other studies Reviewed: Additional studies/ records that were reviewed today include: none. Review of the above records demonstrates: N/A   ASSESSMENT AND PLAN:  1.  Chest pain - precordial. Concerning for angina. With labile HTN also need to consider possible aortic pathology. Needs ischemic evaluation. Recommend coronary CTA which will also allow [place] to assess her aorta. HR is nice and slow on Toprol XL and renal function is OK 2. HTN  3. Hypercholesterolemia. On Statin.      Current medicines are reviewed at length with the patient today.  The patient does not have concerns regarding medicines.  The following changes have been made:  no change  Labs/ tests ordered today include:   No orders of the defined types were placed in this encounter.        Disposition:   FU post CTA  Signed, Haniel Fix [place], MD  05/13/2022 4:04 PM    [facility] [address] [place], [place], [address] Phone [phone], Fax [phone]

## 2022-05-16 ENCOUNTER — Other Ambulatory Visit: Payer: Self-pay | Admitting: Family Medicine

## 2022-05-17 ENCOUNTER — Ambulatory Visit: Payer: Medicare HMO | Attending: Cardiology | Admitting: Cardiology

## 2022-05-22 ENCOUNTER — Other Ambulatory Visit: Payer: Self-pay | Admitting: Family Medicine

## 2022-05-24 ENCOUNTER — Encounter: Payer: Self-pay | Admitting: *Deleted

## 2022-06-09 ENCOUNTER — Other Ambulatory Visit: Payer: Self-pay | Admitting: Family Medicine

## 2022-09-14 DIAGNOSIS — R5383 Other fatigue: Secondary | ICD-10-CM | POA: Diagnosis not present

## 2022-09-14 DIAGNOSIS — U099 Post covid-19 condition, unspecified: Secondary | ICD-10-CM | POA: Diagnosis not present

## 2022-09-15 ENCOUNTER — Ambulatory Visit: Payer: Medicare HMO | Admitting: Internal Medicine

## 2022-09-19 ENCOUNTER — Other Ambulatory Visit: Payer: Self-pay | Admitting: Family Medicine

## 2022-09-30 ENCOUNTER — Encounter: Payer: Self-pay | Admitting: Family Medicine

## 2022-09-30 ENCOUNTER — Ambulatory Visit (INDEPENDENT_AMBULATORY_CARE_PROVIDER_SITE_OTHER): Payer: Medicare HMO | Admitting: Family Medicine

## 2022-09-30 VITALS — BP 120/60 | HR 65 | Temp 97.8°F | Ht 62.0 in | Wt 160.4 lb

## 2022-09-30 DIAGNOSIS — F339 Major depressive disorder, recurrent, unspecified: Secondary | ICD-10-CM

## 2022-09-30 DIAGNOSIS — E782 Mixed hyperlipidemia: Secondary | ICD-10-CM | POA: Diagnosis not present

## 2022-09-30 DIAGNOSIS — K219 Gastro-esophageal reflux disease without esophagitis: Secondary | ICD-10-CM | POA: Diagnosis not present

## 2022-09-30 DIAGNOSIS — M810 Age-related osteoporosis without current pathological fracture: Secondary | ICD-10-CM

## 2022-09-30 DIAGNOSIS — Z0000 Encounter for general adult medical examination without abnormal findings: Secondary | ICD-10-CM

## 2022-09-30 DIAGNOSIS — F5104 Psychophysiologic insomnia: Secondary | ICD-10-CM

## 2022-09-30 DIAGNOSIS — I10 Essential (primary) hypertension: Secondary | ICD-10-CM | POA: Diagnosis not present

## 2022-09-30 LAB — CBC WITH DIFFERENTIAL/PLATELET
Basophils Absolute: 0 10*3/uL (ref 0.0–0.1)
Basophils Relative: 0.5 % (ref 0.0–3.0)
Eosinophils Absolute: 0.1 10*3/uL (ref 0.0–0.7)
Eosinophils Relative: 1.6 % (ref 0.0–5.0)
HCT: 40.7 % (ref 36.0–46.0)
Hemoglobin: 14.1 g/dL (ref 12.0–15.0)
Lymphocytes Relative: 22.7 % (ref 12.0–46.0)
Lymphs Abs: 1.6 10*3/uL (ref 0.7–4.0)
MCHC: 34.6 g/dL (ref 30.0–36.0)
MCV: 96.4 fl (ref 78.0–100.0)
Monocytes Absolute: 0.5 10*3/uL (ref 0.1–1.0)
Monocytes Relative: 7.4 % (ref 3.0–12.0)
Neutro Abs: 4.8 10*3/uL (ref 1.4–7.7)
Neutrophils Relative %: 67.8 % (ref 43.0–77.0)
Platelets: 242 10*3/uL (ref 150.0–400.0)
RBC: 4.23 Mil/uL (ref 3.87–5.11)
RDW: 13 % (ref 11.5–15.5)
WBC: 7.1 10*3/uL (ref 4.0–10.5)

## 2022-09-30 LAB — COMPREHENSIVE METABOLIC PANEL
ALT: 11 U/L (ref 0–35)
AST: 16 U/L (ref 0–37)
Albumin: 4.1 g/dL (ref 3.5–5.2)
Alkaline Phosphatase: 93 U/L (ref 39–117)
BUN: 17 mg/dL (ref 6–23)
CO2: 31 mEq/L (ref 19–32)
Calcium: 8.9 mg/dL (ref 8.4–10.5)
Chloride: 99 mEq/L (ref 96–112)
Creatinine, Ser: 0.8 mg/dL (ref 0.40–1.20)
GFR: 69.7 mL/min (ref >60.00)
Glucose, Bld: 107 mg/dL — ABNORMAL HIGH (ref 70–99)
Potassium: 3.4 mEq/L — ABNORMAL LOW (ref 3.5–5.1)
Sodium: 140 mEq/L (ref 135–145)
Total Bilirubin: 0.4 mg/dL (ref 0.2–1.2)
Total Protein: 6.7 g/dL (ref 6.0–8.3)

## 2022-09-30 LAB — TSH: TSH: 2.35 u[IU]/mL (ref 0.35–5.50)

## 2022-09-30 LAB — VITAMIN D 25 HYDROXY (VIT D DEFICIENCY, FRACTURES): VITD: 48.78 ng/mL (ref 30.00–100.00)

## 2022-09-30 LAB — VITAMIN B12: Vitamin B-12: 543 pg/mL (ref 211–911)

## 2022-09-30 MED ORDER — CHLORTHALIDONE 25 MG PO TABS: 25.0000 mg | ORAL_TABLET | Freq: Every day | ORAL | 2 refills | Status: DC

## 2022-09-30 MED ORDER — PANTOPRAZOLE SODIUM 20 MG PO TBEC: 20.0000 mg | DELAYED_RELEASE_TABLET | Freq: Every day | ORAL | Status: AC | PRN

## 2022-09-30 NOTE — Patient Instructions (Signed)

## 2022-09-30 NOTE — Progress Notes (Signed)
Subjective  Chief Complaint  Patient presents with   Annual Exam    Pt here for Annual Exam and is not currently fasting    Hypertension    HPI: Veronica Myers is a 82 y.o. female who presents to [facility] today for a Female Wellness Visit. She also has the concerns and/or needs as listed above in the chief complaint. These will be addressed in addition to the Health Maintenance Visit.   Wellness Visit: annual visit with health maintenance review and exam without Pap  HM: 82 yo. Screening mammo current. No further colonoscopy needed. Imms current. Had covid in January and recovered well.  Chronic disease f/u and/or acute problem visit: (deemed necessary to be done in addition to the wellness visit): HTN: well controlled on bb and hctz. Due for blood work. No further chest pain. Feels hctz has helped with feeling of abdominal bloating.  Depression: describes multiple stressors: would like to move back to [place]. Maintained on cymbalta GERD: improved. No longer on daily protonix.  Osteoporosis w/ h/o compression fracture of thoracic spine: no longer wants treatment or evaluation. She did complete 3 prolia injections.  HLD: declines treatment.   Assessment  1. Annual physical exam   2. Essential hypertension   3. Major depression, recurrent, chronic ([place])   4. Gastroesophageal reflux disease without esophagitis   5. Age-related osteoporosis without current pathological fracture   6. Psychophysiologic insomnia   7. Mixed hyperlipidemia      Plan  Female Wellness Visit: Age appropriate Health Maintenance and Prevention measures were discussed with patient. Included topics are cancer screening recommendations, ways to keep healthy (see AVS) including dietary and exercise recommendations, regular eye and dental care, use of seat belts, and avoidance of moderate alcohol use and tobacco use.  BMI: discussed patient's BMI and encouraged positive lifestyle  modifications to help get to or maintain a target BMI. HM needs and immunizations were addressed and ordered. See below for orders. See HM and immunization section for updates. Routine labs and screening tests ordered including cmp, cbc and lipids where appropriate. Discussed recommendations regarding Vit D and calcium supplementation (see AVS)  Chronic disease management visit and/or acute problem visit: Osteoporosis: no further evaluation or treatment per pt's wishes. She is competent. At risk for further compression fractures. She does exercise HTN is very well controlled on hctz 12.5 daily and toprol xl 100 daily. Tolerating well. Check renal function and electrolytes. HLD: stop further evaluation and treatment per pt's wishes. No clear reason.  Depression: counseling done. Continue cymbalta 20 daily. Tolerating well and it is helpful.  GERD: stable now. Chronic. Prn protonix 20 recommended.  Poor sleep. Behavioral mgt strategies reviewed.   Follow up: 6 mo for htn recheck  Orders Placed This Encounter  Procedures   CBC with Differential/Platelet   Comprehensive metabolic panel   TSH   VITAMIN D 25 Hydroxy (Vit-D Deficiency, Fractures)   Vitamin B12   Meds ordered this encounter  Medications   pantoprazole (PROTONIX) 20 MG tablet    Sig: Take 1 tablet (20 mg total) by mouth daily as needed for heartburn.      Body mass index is 29.34 kg/m. Wt Readings from Last 3 Encounters:  09/30/22 160 lb 6.4 oz (72.8 kg)  04/14/22 161 lb 3.2 oz (73.1 kg)  04/06/22 158 lb 6.4 oz (71.8 kg)     Patient Active Problem List   Diagnosis Date Noted   Mixed hyperlipidemia 09/30/2021  Priority: High    Pt declines statin treatment. Education given.    Psychophysiologic insomnia 04/11/2020    Priority: High   Major depression, recurrent, chronic ([place]) 12/12/2017    Priority: High    On cymbalta and wellbutrin in past.(from record review) Started low dose prozac in 08/2019 for recurrent  sxs.     Essential hypertension 12/12/2017    Priority: High   Degenerative joint disease (DJD) of lumbar spine 11/16/2019    Priority: Medium    Compression fracture of thoracic vertebra with routine healing 08/07/2019    Priority: Medium    Thoracogenic scoliosis of thoracic region 07/16/2019    Priority: Medium    GERD (gastroesophageal reflux disease) 12/12/2017    Priority: Medium    Osteoporosis 12/12/2017    Priority: Medium     Dexa 02/2018 t + -4.1 lowest spine with compression fracture of spine 08/2019; From old medical records. No dexa report attached. Had been on fosamax.  Started prolia 10/2019 2024: pt refuses further testing or treatment.     Vitiligo 03/31/2018    Priority: Low   History of diverticulitis 12/12/2017    Priority: [place] Maintenance  Topic Date Due   COVID-19 Vaccine (3 - Pfizer risk series) 10/16/2022 (Originally 02/08/2020)   Medicare Annual Wellness (AWV)  02/10/2023   Pneumonia Vaccine 20+ Years old  Completed   INFLUENZA VACCINE  Completed   Zoster Vaccines- Shingrix  Completed   HPV VACCINES  Aged Out   DTaP/Tdap/Td  Discontinued   MAMMOGRAM  Discontinued   DEXA SCAN  Discontinued   Immunization History  Administered Date(s) Administered   Fluad Quad(high Dose 65+) 07/16/2019, 06/09/2021, 06/24/2022   Influenza, High Dose Seasonal PF 07/04/2020   PFIZER(Purple Top)SARS-COV-2 Vaccination 12/21/2019, 01/11/2020   Pneumococcal Conjugate-13 03/30/2018   Pneumococcal Polysaccharide-23 07/16/2019   Zoster Recombinat (Shingrix) 03/30/2018, 06/10/2021   We updated and reviewed the patient's past history in detail and it is documented below. Allergies: Patient has No Known Allergies. 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 Past Surgical History Patient  has no past surgical history on file. Family History: Patient family history includes Arthritis in her father and mother; Depression in her father and mother; Diabetes in her father and mother; Heart disease in her father and mother; Hypertension in her father and mother. Social History:  Patient  reports that she has never smoked. She has never used smokeless tobacco. She reports that she does not currently use alcohol. She reports that she does not use drugs.  Review of Systems: Constitutional: negative for fever or malaise Ophthalmic: negative for photophobia, double vision or loss of vision Cardiovascular: negative for chest pain, dyspnea on exertion, or new LE swelling Respiratory: negative for SOB or persistent cough Gastrointestinal: negative for abdominal pain, change in bowel habits or melena Genitourinary: negative for dysuria or gross hematuria, no abnormal uterine bleeding or disharge Musculoskeletal: negative for new gait disturbance or muscular weakness Integumentary: negative for new or persistent rashes, no breast lumps Neurological: negative for TIA or stroke symptoms Psychiatric: negative for SI or delusions Allergic/Immunologic: negative for hives  Patient Care Team    Relationship Specialty Notifications Start End  Leamon Arnt, MD PCP - General Family Medicine  12/12/17   Gregor Hams, MD Consulting Physician Sports Medicine  09/10/19     Objective  Vitals: BP 120/60   Pulse 65   Temp 97.8 F (36.6 C)   Ht 5\' 2"  (1.575 m)   Wt 160 lb 6.4 oz (72.8 kg)   SpO2 96%   BMI 29.34 kg/m  General:  Well developed, well nourished, no acute distress  Psych:  Alert and orientedx3,normal mood and affect HEENT:  Normocephalic, atraumatic, non-icteric sclera,  supple neck without adenopathy, mass or thyromegaly Cardiovascular:  Normal S1, S2, RRR without gallop, rub or murmur Respiratory:  Good breath sounds  bilaterally, CTAB with normal respiratory effort Gastrointestinal: normal bowel sounds, soft, non-tender, no noted masses. No HSM MSK: no deformities, contusions. Joints are without erythema or swelling.  Skin:  Warm, no rashes or suspicious lesions noted, vitiligo   Commons side effects, risks, benefits, and alternatives for medications and treatment plan prescribed today were discussed, and the patient expressed understanding of the given instructions. Patient is instructed to call or message via MyChart if he/she has any questions or concerns regarding our treatment plan. No barriers to understanding were identified. We discussed Red Flag symptoms and signs in detail. Patient expressed understanding regarding what to do in case of urgent or emergency type symptoms.  Medication list was reconciled, printed and provided to the patient in AVS. Patient instructions and summary information was reviewed with the patient as documented in the AVS. This note was prepared with assistance of Dragon voice recognition software. Occasional wrong-word or sound-a-like substitutions may have occurred due to the inherent limitations of voice recognition software

## 2023-01-13 ENCOUNTER — Ambulatory Visit (INDEPENDENT_AMBULATORY_CARE_PROVIDER_SITE_OTHER): Payer: Medicare HMO | Admitting: Family Medicine

## 2023-01-13 ENCOUNTER — Encounter: Payer: Self-pay | Admitting: Family Medicine

## 2023-01-13 VITALS — BP 120/60 | HR 74 | Temp 98.2°F | Ht 62.0 in | Wt 164.4 lb

## 2023-01-13 DIAGNOSIS — M47816 Spondylosis without myelopathy or radiculopathy, lumbar region: Secondary | ICD-10-CM | POA: Diagnosis not present

## 2023-01-13 DIAGNOSIS — F339 Major depressive disorder, recurrent, unspecified: Secondary | ICD-10-CM | POA: Diagnosis not present

## 2023-01-13 DIAGNOSIS — R202 Paresthesia of skin: Secondary | ICD-10-CM | POA: Diagnosis not present

## 2023-01-13 DIAGNOSIS — M4134 Thoracogenic scoliosis, thoracic region: Secondary | ICD-10-CM | POA: Diagnosis not present

## 2023-01-13 DIAGNOSIS — H5213 Myopia, bilateral: Secondary | ICD-10-CM | POA: Diagnosis not present

## 2023-01-13 DIAGNOSIS — F5104 Psychophysiologic insomnia: Secondary | ICD-10-CM | POA: Diagnosis not present

## 2023-01-13 NOTE — Progress Notes (Signed)
Subjective  CC:  Chief Complaint  Patient presents with   Referral    Pt would like a referral to cardiology   leg numbness   Fatigue   not sleeping    HPI: Veronica Myers is a 82 y.o. female who presents to the office today to address the problems listed above in the chief complaint. 82 year old female presents with multiple concerns, some chronic in nature.  Describes neck pain and back pain.  Known osteoarthritis and history of compression fractures.  No radicular symptoms.  Complains of poor sleep which has been chronic since adulthood.  Has failed melatonin.  Has not used other sleep aids but is very sensitive to medications.  Has history of depression and anxiety but has stopped Cymbalta, she ran out and changed insurance.  She is not sure if it is covered anymore but she says she feels off of it.  More energy.  She has been off for about a month.  She said she slowly weaned it.  She also says that sometimes her legs feel numb but no weakness.  No pain.  Her daughters are worried that she could have a stroke.  However no stroke symptoms.  She has no chest pain or exertional symptoms.  No shortness of breath.  Sometimes when she sleeps on her left side, her left chest wall hurts.  This is not reproducible.  It is intermittent. I reviewed recent note from February, complete physical labs showing mildly elevated fasting glucose at 107.  Otherwise things look good. She does report that she took Osteoflex biflex twice daily and had right lower extremity ankle swelling.  This resolved when she stopped it. She is getting intermittent heartburn but manages by eating healthy foods.  Not bad enough to use medications.  She has used PPIs in the past.    Assessment  1. Major depression, recurrent, chronic (HCC)   2. Psychophysiologic insomnia   3. Paresthesia of left lower extremity   4. Spondylosis of lumbar region without myelopathy or radiculopathy   5. Thoracogenic scoliosis of thoracic  region      Plan  Depression: She self weaned off Cymbalta.  Counseling given.  She will monitor her mood over the next 1 to 2 months and restart if symptoms recur. Primary insomnia plus minus psychophysiologic: Chronic.  Counseling done.  No medications Osteoarthritic pain, multiple sites: Reassured.  Tylenol as needed. Paresthesias: Normal sensation to light touch throughout lower extremities currently.  Normal pulses.  Reassured.  Not typical of strokes Blood pressure mildly elevated today but she is anxious.  Home readings are normal.  No chest pain.  No exertional symptoms.  Had mild hypokalemia on diuretic.  Follow up: As scheduled for recheck.  Will recheck A1c at that time. Will also need recheck potassium level  02/17/2023  No orders of the defined types were placed in this encounter.  No orders of the defined types were placed in this encounter.     I reviewed the patients updated PMH, FH, and SocHx.    Patient Active Problem List   Diagnosis Date Noted   Mixed hyperlipidemia 09/30/2021    Priority: High   Psychophysiologic insomnia 04/11/2020    Priority: High   Major depression, recurrent, chronic (HCC) 12/12/2017    Priority: High   Essential hypertension 12/12/2017    Priority: High   Degenerative joint disease (DJD) of lumbar spine 11/16/2019    Priority: Medium    Compression fracture of thoracic vertebra with routine healing  08/07/2019    Priority: Medium    Thoracogenic scoliosis of thoracic region 07/16/2019    Priority: Medium    GERD (gastroesophageal reflux disease) 12/12/2017    Priority: Medium    Osteoporosis 12/12/2017    Priority: Medium    Vitiligo 03/31/2018    Priority: Low   History of diverticulitis 12/12/2017    Priority: Low   Current Meds  Medication Sig   aspirin EC 81 MG tablet Take 81 mg by mouth daily. Swallow whole.   chlorthalidone (HYGROTON) 25 MG tablet Take 1 tablet (25 mg total) by mouth daily.   metoprolol succinate  (TOPROL-XL) 100 MG 24 hr tablet TAKE 1 TABLET BY MOUTH EVERY DAY    Allergies: Patient has No Known Allergies. Family History: Patient family history includes Arthritis in her father and mother; Depression in her father and mother; Diabetes in her father and mother; Heart disease in her father and mother; Hypertension in her father and mother. Social History:  Patient  reports that she has never smoked. She has never used smokeless tobacco. She reports that she does not currently use alcohol. She reports that she does not use drugs.  Review of Systems: Constitutional: Negative for fever malaise or anorexia Cardiovascular: negative for chest pain Respiratory: negative for SOB or persistent cough Gastrointestinal: negative for abdominal pain  Objective  Vitals: BP 120/60 Comment: By home readings  Pulse 74   Temp 98.2 F (36.8 C)   Ht 5\' 2"  (1.575 m)   Wt 164 lb 6.4 oz (74.6 kg)   SpO2 95%   BMI 30.07 kg/m  General: no acute distress , A&Ox3, appears well HEENT: PEERL, conjunctiva normal, neck is supple Cardiovascular:  RRR without murmur or gallop.  No chest wall tenderness Respiratory:  Good breath sounds bilaterally, CTAB with normal respiratory effort Skin:  Warm, no rashes Neuro: Normal sensation to light touch bilateral lower extremities Extremities: No edema, normal pulses   Commons side effects, risks, benefits, and alternatives for medications and treatment plan prescribed today were discussed, and the patient expressed understanding of the given instructions. Patient is instructed to call or message via MyChart if he/she has any questions or concerns regarding our treatment plan. No barriers to understanding were identified. We discussed Red Flag symptoms and signs in detail. Patient expressed understanding regarding what to do in case of urgent or emergency type symptoms.  Medication list was reconciled, printed and provided to the patient in AVS. Patient instructions and  summary information was reviewed with the patient as documented in the AVS. This note was prepared with assistance of Dragon voice recognition software. Occasional wrong-word or sound-a-like substitutions may have occurred due to the inherent limitations of voice recognition software

## 2023-03-02 ENCOUNTER — Telehealth: Payer: Self-pay | Admitting: Family Medicine

## 2023-03-02 NOTE — Telephone Encounter (Signed)
Caller is Joni Reining, Associate Professor from [facility]. Caller states patient has used them before but insurance was terminated. States once insurance restored, pt requested to use them once more.   Prescription Request  03/02/2023  LOV: 01/13/2023  What is the name of the medication or equipment? chlorthalidone (HYGROTON) 25 MG tablet   metoprolol succinate (TOPROL-XL) 100 MG 24 hr tablet    Have you contacted your pharmacy to request a refill? Yes   Which pharmacy would you like this sent to?  [facility]  Address: [address] [place], [place] [address] Phone: [phone]  Patient notified that their request is being sent to the clinical staff for review and that they should receive a response within 2 business days.   Please advise at Mobile [phone] (mobile)

## 2023-03-04 MED ORDER — CHLORTHALIDONE 25 MG PO TABS: 25.0000 mg | ORAL_TABLET | Freq: Every day | ORAL | 2 refills | Status: AC

## 2023-03-04 MED ORDER — METOPROLOL SUCCINATE ER 100 MG PO TB24: 100.0000 mg | ORAL_TABLET | Freq: Every day | ORAL | 3 refills | Status: DC

## 2023-03-04 NOTE — Telephone Encounter (Signed)
Refill sent to requested pharmacy.

## 2023-03-31 ENCOUNTER — Encounter: Payer: Medicare HMO | Admitting: Family Medicine

## 2023-04-16 ENCOUNTER — Other Ambulatory Visit: Payer: Self-pay | Admitting: Family Medicine

## 2023-05-17 DIAGNOSIS — M19011 Primary osteoarthritis, right shoulder: Secondary | ICD-10-CM | POA: Diagnosis not present

## 2023-05-17 DIAGNOSIS — Z1159 Encounter for screening for other viral diseases: Secondary | ICD-10-CM | POA: Diagnosis not present

## 2023-05-17 DIAGNOSIS — E6609 Other obesity due to excess calories: Secondary | ICD-10-CM | POA: Diagnosis not present

## 2023-05-17 DIAGNOSIS — R030 Elevated blood-pressure reading, without diagnosis of hypertension: Secondary | ICD-10-CM | POA: Diagnosis not present

## 2023-05-17 DIAGNOSIS — E559 Vitamin D deficiency, unspecified: Secondary | ICD-10-CM | POA: Diagnosis not present

## 2023-05-17 DIAGNOSIS — I10 Essential (primary) hypertension: Secondary | ICD-10-CM | POA: Diagnosis not present

## 2023-05-17 DIAGNOSIS — Z0000 Encounter for general adult medical examination without abnormal findings: Secondary | ICD-10-CM | POA: Diagnosis not present

## 2023-05-17 DIAGNOSIS — Z114 Encounter for screening for human immunodeficiency virus [HIV]: Secondary | ICD-10-CM | POA: Diagnosis not present

## 2024-01-12 ENCOUNTER — Ambulatory Visit: Admitting: Physical Therapy

## 2024-02-16 ENCOUNTER — Telehealth: Payer: Self-pay

## 2024-02-16 NOTE — Telephone Encounter (Signed)
 Copied from CRM [id number]. Topic: Appointments - Transfer of Care >> Feb 15, 2024 11:36 AM Veronica Myers wrote: Pt is requesting to transfer FROM: Veronica Myers Pt is requesting to transfer TO: Veronica Myers Reason for requested transfer: None provided It is the responsibility of the team the patient would like to transfer to (Dr. Darrold Emms) to reach out to the patient if for any reason this transfer is not acceptable.

## 2024-03-06 NOTE — Telephone Encounter (Signed)
 Patient requesting to transfer from [place] to [place]. Please advise.

## 2024-03-06 NOTE — Telephone Encounter (Signed)
 Tried calling Patient but unable to leave message to inform Patient re: [facility] request denied.

## 2024-04-10 ENCOUNTER — Encounter: Admitting: Physician Assistant

## 2024-05-18 DIAGNOSIS — H6121 Impacted cerumen, right ear: Secondary | ICD-10-CM | POA: Diagnosis not present

## 2024-07-11 ENCOUNTER — Telehealth: Payer: Self-pay

## 2024-07-11 NOTE — Telephone Encounter (Signed)
 [facility] listed as PCP with [employer]. Patient will need an appt to establish care if in need of PCP. Called patient. No VM, no answer.

## 2024-07-17 ENCOUNTER — Telehealth: Payer: Self-pay

## 2024-07-17 NOTE — Telephone Encounter (Signed)
 [facility] listed as PCP with [employer]. Patient will need an appt to establish care if in need of PCP. Called patient. No VM, no answer.

## 2024-10-08 ENCOUNTER — Ambulatory Visit: Admitting: Family Medicine
